# Patient Record
Sex: Male | Born: 1965 | Race: Black or African American | Hispanic: No | Marital: Married | Smoking: Never smoker
Health system: Southern US, Community
[De-identification: ages and names within clinical notes are randomized; demographics above are authoritative.]

## PROBLEM LIST (undated history)

## (undated) DIAGNOSIS — G8929 Other chronic pain: Secondary | ICD-10-CM

## (undated) DIAGNOSIS — N183 Chronic kidney disease, stage 3 unspecified: Secondary | ICD-10-CM

## (undated) HISTORY — DX: Chronic kidney disease, stage 3 unspecified: N18.30

## (undated) HISTORY — DX: Other chronic pain: G89.29

---

## 2012-05-22 ENCOUNTER — Emergency Department (HOSPITAL_COMMUNITY): Payer: Self-pay

## 2012-05-22 ENCOUNTER — Encounter (HOSPITAL_COMMUNITY): Payer: Self-pay | Admitting: *Deleted

## 2012-05-22 ENCOUNTER — Emergency Department (HOSPITAL_COMMUNITY)
Admission: EM | Admit: 2012-05-22 | Discharge: 2012-05-22 | Disposition: A | Discharge status: Home / Self Care | Payer: Self-pay | Attending: Emergency Medicine | Admitting: Emergency Medicine

## 2012-05-22 DIAGNOSIS — R0789 Other chest pain: Secondary | ICD-10-CM | POA: Insufficient documentation

## 2012-05-22 LAB — CK TOTAL AND CKMB (NOT AT ARMC)
CK, MB: 2.9 ng/mL (ref 0.3–4.0)
Relative Index: 0.6 (ref 0.0–2.5)
Total CK: 499 U/L — ABNORMAL HIGH (ref 7–232)

## 2012-05-22 LAB — CBC WITH DIFFERENTIAL/PLATELET
Basophils Absolute: 0 10*3/uL (ref 0.0–0.1)
HCT: 42.6 % (ref 39.0–52.0)
Lymphocytes Relative: 28 % (ref 12–46)
Neutro Abs: 4.1 10*3/uL (ref 1.7–7.7)
Platelets: 161 10*3/uL (ref 150–400)
RDW: 13.1 % (ref 11.5–15.5)
WBC: 6.8 10*3/uL (ref 4.0–10.5)

## 2012-05-22 LAB — TROPONIN I
Troponin I: <0.3 ng/mL (ref <0.30)
Troponin I: <0.3 ng/mL (ref <0.30)

## 2012-05-22 LAB — POCT I-STAT, CHEM 8
Calcium, Ion: 1.2 mmol/L (ref 1.12–1.32)
HCT: 46 % (ref 39.0–52.0)
TCO2: 22 mmol/L (ref 0–100)

## 2012-05-22 MED ORDER — SODIUM CHLORIDE 0.9 % IV BOLUS (SEPSIS)
1000.0000 mL | Freq: Once | INTRAVENOUS | Status: AC
  Administered 2012-05-22: 1000 mL via INTRAVENOUS

## 2012-05-22 MED ORDER — ASPIRIN 81 MG PO CHEW
324.0000 mg | CHEWABLE_TABLET | Freq: Once | ORAL | Status: AC
  Administered 2012-05-22: 162 mg via ORAL
  Filled 2012-05-22: qty 2

## 2012-05-22 NOTE — ED Notes (Signed)
Pt. Reports intermittent mid-sternal/left chest pain for the past two days. States that "I will have a squeezing pain for a couple seconds and then it will go away for hours sometimes and then come back".  Denies N/V or SOB.

## 2012-05-22 NOTE — ED Provider Notes (Signed)
History     CSN: [id number]  Arrival date & time 05/22/12  1154   First MD Initiated Contact with Patient 05/22/12 1211      Chief Complaint  Patient presents with  . Chest Pain    (Consider location/radiation/quality/duration/timing/severity/associated sxs/prior treatment) HPI  46 year old male with no prior cardiac history presents with chief complaint of chest pain. Patient states for the past couple days he has had intermittent sensation of a sharp pain to his mid chest. Pain usually lasting only for seconds and resolved without any treatment. This a.m. while sitting and talking he experiencing pressure and squeezing sensation to his left chest. Sensation lasting for 5 minutes and resolved. However, has never experiencing similar, which worries him. Nothing makes the pain worse or better. He denies associated nausea, diaphoresis, shortness of breath. He denies exertional component. States he is generally healthy. He is a nonsmoker. Patient denies any strenuous exercise activities, however does endorse any work on a regular basis. Patient also mentioned that he's been more stressed out than usual, and unsure of this is stress related. Denies any recent alcohol or recreational drug use. Denies calf pain or leg swelling.  History reviewed. No pertinent past medical history.  History reviewed. No pertinent past surgical history.  History reviewed. No pertinent family history.  History  Substance Use Topics  . Smoking status: Not on file  . Smokeless tobacco: Not on file  . Alcohol Use: No      Review of Systems  All other systems reviewed and are negative.    Allergies  Review of patient's allergies indicates no known allergies.  Home Medications  No current outpatient prescriptions on file.  BP 135/79  Pulse 80  Temp 98.6 F (37 C) (Oral)  Resp 18  SpO2 95%  Physical Exam  Nursing note and vitals reviewed. Constitutional: He appears well-developed and  well-nourished. No distress.       Awake, alert, nontoxic appearance. Muscularly built  HENT:  Head: Atraumatic.  Eyes: Conjunctivae are normal. Right eye exhibits no discharge. Left eye exhibits no discharge.  Neck: Normal range of motion. Neck supple. No JVD present.  Cardiovascular: Normal rate and regular rhythm.  Exam reveals no gallop and no friction rub.   No murmur heard. Pulmonary/Chest: Effort normal. No respiratory distress. He has no wheezes. He has no rales. He exhibits no tenderness.       L chest wall mildly tender on palpation without overlying skin changes.  No rash, no crepitus, no emphysema  Abdominal: Soft. There is no tenderness. There is no rebound.  Musculoskeletal: He exhibits no edema and no tenderness.       ROM appears intact, no obvious focal weakness  Neurological: He is alert.  Skin: Skin is warm and dry. No rash noted.  Psychiatric: He has a normal mood and affect.    ED Course  Procedures (including critical care time)  Labs Reviewed - No data to display No results found.   No diagnosis found.   Date: 05/22/2012  Rate: 82  Rhythm: normal sinus rhythm  QRS Axis: normal  Intervals: normal  ST/T Wave abnormalities: normal  Conduction Disutrbances:none  Narrative Interpretation: early repol  Old EKG Reviewed: none available  Results for orders placed during the hospital encounter of 05/22/12  CBC WITH DIFFERENTIAL      Component Value Range   WBC 6.8  4.0 - 10.5 K/uL   RBC 4.94  4.22 - 5.81 MIL/uL   Hemoglobin 14.8  13.0 -  17.0 g/dL   HCT 11.9  14.7 - 82.9 %   MCV 86.2  78.0 - 100.0 fL   MCH 30.0  26.0 - 34.0 pg   MCHC 34.7  30.0 - 36.0 g/dL   RDW 56.2  13.0 - 86.5 %   Platelets 161  150 - 400 K/uL   Neutrophils Relative 61  43 - 77 %   Neutro Abs 4.1  1.7 - 7.7 K/uL   Lymphocytes Relative 28  12 - 46 %   Lymphs Abs 1.9  0.7 - 4.0 K/uL   Monocytes Relative 10  3 - 12 %   Monocytes Absolute 0.7  0.1 - 1.0 K/uL   Eosinophils Relative 1   0 - 5 %   Eosinophils Absolute 0.1  0.0 - 0.7 K/uL   Basophils Relative 0  0 - 1 %   Basophils Absolute 0.0  0.0 - 0.1 K/uL  CK TOTAL AND CKMB      Component Value Range   Total CK 499 (*) 7 - 232 U/L   CK, MB 2.9  0.3 - 4.0 ng/mL   Relative Index 0.6  0.0 - 2.5  TROPONIN I      Component Value Range   Troponin I <0.30  <0.30 ng/mL  POCT I-STAT, CHEM 8      Component Value Range   Sodium 141  135 - 145 mEq/L   Potassium 4.2  3.5 - 5.1 mEq/L   Chloride 108  96 - 112 mEq/L   BUN 20  6 - 23 mg/dL   Creatinine, Ser 7.84 (*) 0.50 - 1.35 mg/dL   Glucose, Bld 93  70 - 99 mg/dL   Calcium, Ion 6.96  1.12 - 1.32 mmol/L   TCO2 22  0 - 100 mmol/L   Hemoglobin 15.6  13.0 - 17.0 g/dL   HCT 29.5  28.4 - 13.2 %   Dg Chest 2 View  05/22/2012  *RADIOLOGY REPORT*  Clinical Data: Chest pain and shortness of breath.  CHEST - 2 VIEW  Comparison: No priors.  Findings: Lung volumes are normal.  No consolidative airspace disease.  No pleural effusions.  No pneumothorax.  No pulmonary nodule or mass noted.  Pulmonary vasculature and the cardiomediastinal silhouette are within normal limits.  IMPRESSION: 1. No radiographic evidence of acute cardiopulmonary disease.  Original Report Authenticated By: Florencia Reasons, M.D.    44:01:02 Orthostatic Vital Signs Orthstatic P/BP - Pulse Rate: 78 ; Pulse Rate Source: Dinamap ; BP: 121/79 ; BP Location: Right arm ; BP Method: Automatic ; Patient Position, if appropriate: Lying Jody L Oberthaler 14:52:46 Orthostatic Vital Signs Orthstatic P/BP - Pulse Rate: 76 ; Pulse Rate Source: Dinamap ; BP: 127/82 ; BP Location: Right arm ; BP Method: Automatic ; Patient Position, if appropriate: Sitting Jody L Oberthaler 14:53 Orthostatic Vital Signs Orthstatic P/BP - Pulse Rate: 73 ; Pulse Rate Source: Dinamap ; BP: 123/92 ; BP Location: Right arm ; BP Method: Automatic ; Patient Position, if appropriate: Standing Jody L Oberthaler   MDM  Atypical chest pain. Patient has a TIMI  score of 0. He is afebrile with stable normal vital signs. No respiratory involvement.  He is muscularly built.  Pain not reproducible with palpation or with ROM.  Will check CK total to r/o rhabdo.  Will obtain ECG, CXR, Troponin, CBC, Istat.    2:47 PM Pt report to nurse that he experienced sensation of lightheadedness and blurry vision briefly yesterday when he was having his  chest discomfort.  Sxs transient. Will check orthostatic VS.    2:57 PM She has a total CK of 499. History of function is remarkable for creatinine of 1.6. Other this may be related to muscle breakdown from working out, however level not significant to consider for rhabdo. First set of troponin is unremarkable, will check delta troponin and will consult with cardiology.  Normal orthostatic vital sign  4:13 PM Normal delta troponin.  Pt asymptomatic.  Pt agrees to f/u with [facility] for further evaluation.  Pt stable to be discharge.    Fayrene Helper, PA-C 05/22/12 1614

## 2012-05-22 NOTE — ED Provider Notes (Signed)
Medical screening examination/treatment/procedure(s) were conducted as a shared visit with non-physician practitioner(s) and myself.  I personally evaluated the patient during the encounter  Doug Sou, MD 05/22/12 1635

## 2012-05-22 NOTE — ED Notes (Signed)
Pt. Talked to this RN about additional concerns. States that when he studies his vision sometimes gets blurry and he feels lightheaded. Also states this happened yesterday when he was not studying. Will notify MD.

## 2012-05-22 NOTE — ED Notes (Signed)
Reports having left side chest pain intermittent for several days but more severe and cramping today. Denies any sob. No acute distress noted at triage.

## 2012-05-22 NOTE — ED Provider Notes (Signed)
Complains of chest pain for several weeks intermittent lasting 1 or 2 seconds at a time. Today he had an episode lasting 15 seconds, sharp at left anterior chest followed by a dull ache which lasted for approximately 15 minutes. No associated symptoms, no nausea shortness of breath or sweatiness. Patient exercises 5 times per week and never has chest pain during exertion. Patient asymptomatic as I examine him Cardiac risk factors none On exam a well-developed well-nourished male alert nontoxic lungs clear auscultation heart regular rate and rhythm no murmurs abdomen nondistended nontender Rectal decision making symptoms highly atypical for acute coronary syndrome Plan outpatient cardiac workup Diagnosis atypical chest pain  Doug Sou, MD 05/22/12 1605

## 2012-05-22 NOTE — Discharge Instructions (Signed)
You have been evaluated for your chest discomfort.  Please contact [facility] for outpatient follow up.  Return if your symptoms worsen or if you have any other concerns.    Chest Pain (Nonspecific) Chest pain has many causes. Your pain could be caused by something serious, such as a heart attack or a blood clot in the lungs. It could also be caused by something less serious, such as a chest bruise or a virus. Follow up with your doctor. More lab tests or other studies may be needed to find the cause of your pain. Most of the time, nonspecific chest pain will improve within 2 to 3 days of rest and mild pain medicine. HOME CARE  For chest bruises, you may put ice on the sore area for 15 to 20 minutes, 3 to 4 times a day. Do this only if it makes you or your child feel better.   Put ice in a plastic bag.   Place a towel between the skin and the bag.   Rest for the next 2 to 3 days.   Go back to work if the pain improves.   See your doctor if the pain lasts longer than 1 to 2 weeks.   Only take medicine as told by your doctor.   Quit smoking if you smoke.  GET HELP RIGHT AWAY IF:   There is more pain or pain that spreads to the arm, neck, jaw, back, or belly (abdomen).   You or your child has shortness of breath.   You or your child coughs more than usual or coughs up blood.   You or your child has very bad back or belly pain, feels sick to his or her stomach (nauseous), or throws up (vomits).   You or your child has very bad weakness.   You or your child passes out (faints).   You or your child has a temperature by mouth above 102 F (38.9 C), not controlled by medicine.  Any of these problems may be serious and may be an emergency. Do not wait to see if the problems will go away. Get medical help right away. Call your local emergency services 911 in [place] Do not drive yourself to the hospital. MAKE SURE YOU:   Understand these instructions.   Will watch this  condition.   Will get help right away if you or your child is not doing well or gets worse.  Document Released: 04/28/2008 Document Revised: 10/30/2011 Document Reviewed: 04/28/2008 [facility] Patient Information 2012 [place], [place].

## 2012-05-22 NOTE — ED Notes (Signed)
MD at bedside. 

## 2012-07-25 ENCOUNTER — Emergency Department (HOSPITAL_COMMUNITY)
Admission: EM | Admit: 2012-07-25 | Discharge: 2012-07-25 | Disposition: A | Discharge status: Home / Self Care | Payer: Self-pay | Attending: Emergency Medicine | Admitting: Emergency Medicine

## 2012-07-25 ENCOUNTER — Encounter (HOSPITAL_COMMUNITY): Payer: Self-pay | Admitting: *Deleted

## 2012-07-25 DIAGNOSIS — H01009 Unspecified blepharitis unspecified eye, unspecified eyelid: Secondary | ICD-10-CM | POA: Insufficient documentation

## 2012-07-25 DIAGNOSIS — H01006 Unspecified blepharitis left eye, unspecified eyelid: Secondary | ICD-10-CM

## 2012-07-25 MED ORDER — ERYTHROMYCIN 5 MG/GM OP OINT: TOPICAL_OINTMENT | OPHTHALMIC | Status: AC

## 2012-07-25 NOTE — ED Notes (Signed)
Pt woke up with left eye swelling. Denies any injury. Reports mild tenderness/pressure. No blurred vision.

## 2012-07-26 NOTE — ED Provider Notes (Signed)
History     CSN: [id number]  Arrival date & time 07/25/12  6962   First MD Initiated Contact with Patient 07/25/12 1001      Chief Complaint  Patient presents with  . Facial Swelling    (Consider location/radiation/quality/duration/timing/severity/associated sxs/prior treatment) HPI Hx from pt. Brandon Byrd is a 46 y.o. male who presents with L eyelid swelling. States he noted this yesterday evening but woke up today and it was markedly worse. No known injury to the eye. Pt denies any FB sensation, visual change, redness of the eye itself, discharge or excess tearing. He thought it may have been pink eye and had some old antibiotic eyedrops that he was prescribed previously; he used these once but they did not help. Pt does report some mild tenderness to the area. He has never had anything like this before.  History reviewed. No pertinent past medical history.  History reviewed. No pertinent past surgical history.  History reviewed. No pertinent family history.  History  Substance Use Topics  . Smoking status: Never Smoker   . Smokeless tobacco: Not on file  . Alcohol Use: No      Review of Systems as per HPI  Allergies  Review of patient's allergies indicates no known allergies.  Home Medications   Current Outpatient Rx  Name Route Sig Dispense Refill  . ASPIRIN EC 81 MG PO TBEC Oral Take 81 mg by mouth daily.    . ADULT MULTIVITAMIN W/MINERALS CH Oral Take 1 tablet by mouth daily.    Marland Kitchen NAPROXEN SODIUM 220 MG PO TABS Oral Take 220-440 mg by mouth 2 (two) times daily as needed. For pain    . VITAMIN D (CHOLECALCIFEROL) PO Oral Take 1 capsule by mouth daily.    . ERYTHROMYCIN 5 MG/GM OP OINT  Place a 1/2 inch ribbon of ointment into the lower eyelid. Use twice a day for 7 days. 3.5 g 0    BP 125/79  Pulse 86  Temp 98.3 F (36.8 C) (Oral)  Resp 18  SpO2 97%  Physical Exam  Nursing note and vitals reviewed. Constitutional: He appears well-developed and  well-nourished. No distress.  HENT:  Head: Normocephalic and atraumatic.  Mouth/Throat: Oropharynx is clear and moist. No oropharyngeal exudate.  Eyes: Conjunctivae and EOM are normal. Pupils are equal, round, and reactive to light. Right eye exhibits no discharge. Left eye exhibits no discharge and no hordeolum. Left conjunctiva is not injected.       Edema and mild erythema to L upper lid, no obvious hordeolum. No drainage from eye. Sclera and conjunctiva nl  Neck: Normal range of motion.  Cardiovascular: Normal rate.   Pulmonary/Chest: Effort normal.  Musculoskeletal: Normal range of motion.  Neurological: He is alert.  Skin: Skin is warm and dry. He is not diaphoretic.  Psychiatric: He has a normal mood and affect.    ED Course  Procedures (including critical care time)  Labs Reviewed - No data to display No results found.   1. Blepharitis of left eye       MDM  Pt presents with edema and mild erythema to L upper lid. No other sx with this. Appears c/w blepharitis. Rx erythro ointment. Advised warm compresses. Gave ophthalmology contact info should this not improve. Reasons to return discussed.        Grant Fontana, PA-C 07/26/12 1432

## 2012-07-26 NOTE — ED Provider Notes (Signed)
Medical screening examination/treatment/procedure(s) were performed by non-physician practitioner and as supervising physician I was immediately available for consultation/collaboration.  Juliet Rude. Rubin Payor, MD 07/26/12 [phone]

## 2012-11-13 ENCOUNTER — Emergency Department (HOSPITAL_COMMUNITY): Payer: Self-pay

## 2012-11-13 ENCOUNTER — Emergency Department (HOSPITAL_COMMUNITY)
Admission: EM | Admit: 2012-11-13 | Discharge: 2012-11-13 | Disposition: A | Discharge status: Home / Self Care | Payer: Self-pay | Attending: Emergency Medicine | Admitting: Emergency Medicine

## 2012-11-13 ENCOUNTER — Encounter (HOSPITAL_COMMUNITY): Payer: Self-pay | Admitting: Family Medicine

## 2012-11-13 DIAGNOSIS — Z7982 Long term (current) use of aspirin: Secondary | ICD-10-CM | POA: Insufficient documentation

## 2012-11-13 DIAGNOSIS — R21 Rash and other nonspecific skin eruption: Secondary | ICD-10-CM | POA: Insufficient documentation

## 2012-11-13 DIAGNOSIS — Z79899 Other long term (current) drug therapy: Secondary | ICD-10-CM | POA: Insufficient documentation

## 2012-11-13 DIAGNOSIS — Z23 Encounter for immunization: Secondary | ICD-10-CM | POA: Insufficient documentation

## 2012-11-13 DIAGNOSIS — L02419 Cutaneous abscess of limb, unspecified: Secondary | ICD-10-CM | POA: Insufficient documentation

## 2012-11-13 DIAGNOSIS — IMO0002 Reserved for concepts with insufficient information to code with codable children: Secondary | ICD-10-CM

## 2012-11-13 LAB — BASIC METABOLIC PANEL
Chloride: 106 mEq/L (ref 96–112)
Creatinine, Ser: 1.62 mg/dL — ABNORMAL HIGH (ref 0.50–1.35)
GFR calc Af Amer: 57 mL/min — ABNORMAL LOW (ref >90)
GFR calc non Af Amer: 49 mL/min — ABNORMAL LOW (ref >90)
Potassium: 4.4 mEq/L (ref 3.5–5.1)

## 2012-11-13 LAB — CBC WITH DIFFERENTIAL/PLATELET
Basophils Absolute: 0 10*3/uL (ref 0.0–0.1)
Basophils Relative: 0 % (ref 0–1)
Eosinophils Absolute: 0.1 10*3/uL (ref 0.0–0.7)
MCHC: 33.7 g/dL (ref 30.0–36.0)
Monocytes Absolute: 1.1 10*3/uL — ABNORMAL HIGH (ref 0.1–1.0)
Neutro Abs: 8.3 10*3/uL — ABNORMAL HIGH (ref 1.7–7.7)
Neutrophils Relative %: 73 % (ref 43–77)
RDW: 13.1 % (ref 11.5–15.5)

## 2012-11-13 MED ORDER — LIDOCAINE-EPINEPHRINE 2 %-1:100000 IJ SOLN
20.0000 mL | Freq: Once | INTRAMUSCULAR | Status: DC
  Filled 2012-11-13: qty 20

## 2012-11-13 MED ORDER — CLINDAMYCIN PHOSPHATE 600 MG/50ML IV SOLN
600.0000 mg | Freq: Once | INTRAVENOUS | Status: AC
  Administered 2012-11-13: 600 mg via INTRAVENOUS
  Filled 2012-11-13: qty 50

## 2012-11-13 MED ORDER — CLINDAMYCIN HCL 150 MG PO CAPS: 150.0000 mg | ORAL_CAPSULE | Freq: Four times a day (QID) | ORAL | Status: DC

## 2012-11-13 MED ORDER — HYDROCODONE-ACETAMINOPHEN 5-325 MG PO TABS: 2.0000 | ORAL_TABLET | ORAL | Status: DC | PRN

## 2012-11-13 MED ORDER — TETANUS-DIPHTH-ACELL PERTUSSIS 5-2.5-18.5 LF-MCG/0.5 IM SUSP
0.5000 mL | Freq: Once | INTRAMUSCULAR | Status: AC
Start: 2012-11-13 — End: 2012-11-13
  Administered 2012-11-13: 0.5 mL via INTRAMUSCULAR
  Filled 2012-11-13: qty 0.5

## 2012-11-13 MED ORDER — OXYCODONE-ACETAMINOPHEN 5-325 MG PO TABS
1.0000 | ORAL_TABLET | Freq: Once | ORAL | Status: AC
  Administered 2012-11-13: 1 via ORAL
  Filled 2012-11-13: qty 1

## 2012-11-13 NOTE — ED Notes (Signed)
Per pt sts 1 week of swollen knee and pain. sts he thinks there is a cyst on it. Hx of the same.

## 2012-11-13 NOTE — ED Provider Notes (Signed)
History     CSN: [id number]  Arrival date & time 11/13/12  1517   First MD Initiated Contact with Patient 11/13/12 1702      Chief Complaint  Patient presents with  . Knee Pain    (Consider location/radiation/quality/duration/timing/severity/associated sxs/prior treatment) HPI  46 year old male presents complaining of left knee pain. Patient reports for the past week he has noticed a pimple on his right anterior knee in which he tried to pop it. Subsequently he notice increase swelling and pain to his knee. Onset was gradual, persistent, moderate in intensity, with associated swelling, and redness that extends down to his leg. Mildly improved with Tylenol and ibuprofen. He denies fever, chills, numbness, weakness, joint pain. No history of diabetes. No history of abscess. No history of gout. Patient does not recall his last tetanus shot.  History reviewed. No pertinent past medical history.  History reviewed. No pertinent past surgical history.  History reviewed. No pertinent family history.  History  Substance Use Topics  . Smoking status: Never Smoker   . Smokeless tobacco: Not on file  . Alcohol Use: No      Review of Systems  Constitutional: Negative for fever and chills.  Musculoskeletal: Negative for back pain, joint swelling and arthralgias.  Skin: Positive for rash and wound.  Neurological: Negative for headaches.    Allergies  Review of patient's allergies indicates no known allergies.  Home Medications   Current Outpatient Rx  Name  Route  Sig  Dispense  Refill  . ASPIRIN EC 81 MG PO TBEC   Oral   Take 81 mg by mouth daily.         . ADULT MULTIVITAMIN W/MINERALS CH   Oral   Take 1 tablet by mouth daily.         Marland Kitchen NAPROXEN SODIUM 220 MG PO TABS   Oral   Take 220-440 mg by mouth 2 (two) times daily as needed. For pain         . VITAMIN D (CHOLECALCIFEROL) PO   Oral   Take 1 capsule by mouth daily.           BP 117/72  Pulse 95   Temp 98 F (36.7 C)  Resp 18  SpO2 95%  Physical Exam  Nursing note and vitals reviewed. Constitutional: He is oriented to person, place, and time. He appears well-developed and well-nourished. No distress.  HENT:  Head: Atraumatic.  Neck: Neck supple.  Musculoskeletal: He exhibits edema (depending edema surrounding the anterior right knee extending down to mid shin with cellulitic changes).       And there is an abscess with associate cellulitic changes to right anterior knee, tender on palpation with fluctuance and induration. No apparent joint involvement.  Neurological: He is alert and oriented to person, place, and time.  Skin: Skin is warm.    ED Course  Procedures (including critical care time)   Labs Reviewed  CBC WITH DIFFERENTIAL  BASIC METABOLIC PANEL   Results for orders placed during the hospital encounter of 11/13/12  CBC WITH DIFFERENTIAL      Component Value Range   WBC 11.5 (*) 4.0 - 10.5 K/uL   RBC 4.61  4.22 - 5.81 MIL/uL   Hemoglobin 13.7  13.0 - 17.0 g/dL   HCT 84.1  32.4 - 40.1 %   MCV 88.3  78.0 - 100.0 fL   MCH 29.7  26.0 - 34.0 pg   MCHC 33.7  30.0 - 36.0 g/dL   RDW  13.1  11.5 - 15.5 %   Platelets 197  150 - 400 K/uL   Neutrophils Relative 73  43 - 77 %   Neutro Abs 8.3 (*) 1.7 - 7.7 K/uL   Lymphocytes Relative 16  12 - 46 %   Lymphs Abs 1.9  0.7 - 4.0 K/uL   Monocytes Relative 10  3 - 12 %   Monocytes Absolute 1.1 (*) 0.1 - 1.0 K/uL   Eosinophils Relative 1  0 - 5 %   Eosinophils Absolute 0.1  0.0 - 0.7 K/uL   Basophils Relative 0  0 - 1 %   Basophils Absolute 0.0  0.0 - 0.1 K/uL  BASIC METABOLIC PANEL      Component Value Range   Sodium 141  135 - 145 mEq/L   Potassium 4.4  3.5 - 5.1 mEq/L   Chloride 106  96 - 112 mEq/L   CO2 23  19 - 32 mEq/L   Glucose, Bld 103 (*) 70 - 99 mg/dL   BUN 28 (*) 6 - 23 mg/dL   Creatinine, Ser 5.28 (*) 0.50 - 1.35 mg/dL   Calcium 9.6  8.4 - 41.3 mg/dL   GFR calc non Af Amer 49 (*) >90 mL/min   GFR calc  Af Amer 57 (*) >90 mL/min   Dg Knee 2 Views Right  11/13/2012  *RADIOLOGY REPORT*  Clinical Data: Knee pain  RIGHT KNEE - 1-2 VIEW  Comparison: None.  Findings: Two views of the right knee submitted.  No acute fracture or subluxation.  There is infrapatellar/pretibial anterior soft tissue swelling.  IMPRESSION: No acute fracture or subluxation.  There is infrapatellar/pretibial anterior soft tissue swelling.   Original Report Authenticated By: Natasha Mead, M.D.      INCISION AND DRAINAGE Performed by: Fayrene Helper Consent: Verbal consent obtained. Risks and benefits: risks, benefits and alternatives were discussed Type: abscess  Body area: R anterior knee  Anesthesia: local infiltration  Incision was made with a scalpel.  Local anesthetic: lidocaine 2% w epinephrine  Anesthetic total: 3 ml  Complexity: complex Blunt dissection to break up loculations  Drainage: purulent  Drainage amount: moderate  Packing material: 1/4 in iodoform gauze  Patient tolerance: Patient tolerated the procedure well with no immediate complications.    1.  Cutaneous abscess cellulitis of R anterior knee.    MDM  Patient presents with an abscess to his right anterior knee. With no significant evidence of joint involvement. Doubt septic arthritis.  There are cellulitic skin changes extending down to mid shin.  Will performed I&D and will give clinda via IV as pt has IV line established.  tdap given, pain medication given.  6:05 PM Successful I&D with moderate pustular discharge.  Pt tolerates well.  IV clinda given.  Care instruction including warm compress discussed.  Pt to return in 2 days for wound recheck and packing removal.    7:38 PM IV abx finished.  Pt stable for discharge.    BP 126/80  Pulse 89  Temp 98.8 F (37.1 C) (Oral)  Resp 18  SpO2 97%  I have reviewed nursing notes and vital signs. I personally reviewed the imaging tests through PACS system  I reviewed available  ER/hospitalization records thought the EMR     Fayrene Helper, [place] 11/13/12 1938

## 2012-11-14 NOTE — ED Provider Notes (Signed)
Medical screening examination/treatment/procedure(s) were performed by non-physician practitioner and as supervising physician I was immediately available for consultation/collaboration.   Carleene Cooper III, MD 11/14/12 [phone]

## 2015-11-25 HISTORY — PX: COLONOSCOPY: SHX174

## 2019-11-25 DIAGNOSIS — E119 Type 2 diabetes mellitus without complications: Secondary | ICD-10-CM

## 2019-11-25 HISTORY — DX: Type 2 diabetes mellitus without complications: E11.9

## 2020-10-31 ENCOUNTER — Other Ambulatory Visit: Payer: Self-pay

## 2020-10-31 ENCOUNTER — Ambulatory Visit (INDEPENDENT_AMBULATORY_CARE_PROVIDER_SITE_OTHER): Payer: 59 | Admitting: Medical

## 2020-10-31 ENCOUNTER — Encounter: Payer: Self-pay | Admitting: Medical

## 2020-10-31 VITALS — BP 118/88 | HR 88 | Ht 68.0 in | Wt 200.0 lb

## 2020-10-31 DIAGNOSIS — N189 Chronic kidney disease, unspecified: Secondary | ICD-10-CM | POA: Insufficient documentation

## 2020-10-31 DIAGNOSIS — E1122 Type 2 diabetes mellitus with diabetic chronic kidney disease: Secondary | ICD-10-CM | POA: Diagnosis not present

## 2020-10-31 LAB — POCT GLYCOSYLATED HEMOGLOBIN (HGB A1C): Hemoglobin A1C: 6.6 % — AB (ref 4.0–5.6)

## 2020-10-31 MED ORDER — BD PEN NEEDLE NANO U/F 32G X 4 MM MISC: 1.0000 | Freq: Every day | 2 refills | Status: DC

## 2020-10-31 MED ORDER — OZEMPIC (0.25 OR 0.5 MG/DOSE) 2 MG/1.5ML ~~LOC~~ SOPN: 0.5000 mg | PEN_INJECTOR | SUBCUTANEOUS | 2 refills | Status: DC

## 2020-10-31 NOTE — Progress Notes (Signed)
Subjective:  Brandon Byrd is a 54 y.o. male who presents for Chief Complaint  Patient presents with  . New Patient (Initial Visit)    consult due to new onset diabetes-has chronic Kidney disease   . Blood Sugar Problem    new diabetic      Here to establish care.  Usually sees [place] [facility] in [place], but needs local PCP.    He has concerns about kidney and diabetes.    He has been diagnosed with kidney disease about a year and a half ago.  He is not exactly sure what the cause was.  He does note prior use of Aleve fairly regularly and bodybuilding supplements.  But he was never really advised what the cause ultimately was.  He notes history of imaging at the [facility] and was told many years ago that he had a "weak" kidney.    Diabetes was diagnosed 6 months ago.  He was initially advised to use Metformin but he declined after researching potential problems with the medicine and did not feel comfortable with it.  He took it a few times and felt horrible at the gym so he quit taking it.  His provider at the [facility] recommended Ozempic which she was interested in doing but was advised to get it through PCP here as the [place] system would make it more complicated to get Ozempic  Otherwise been in usual state of health.  He works out rarely.  He tries to eat very healthy.  He has been checking home sugars and they have been less than 120 fasting  He brought in some lab work he had done just in the last week for comprehensive metabolic, CBC, lipid, hepatic.  No other aggravating or relieving factors.    No other c/o.  No past medical history on file.  Current Outpatient Medications on File Prior to Visit  Medication Sig Dispense Refill  . metFORMIN (GLUCOPHAGE-XR) 500 MG 24 hr tablet Take 500 mg by mouth 2 (two) times daily.    Marland Kitchen aspirin EC 81 MG tablet Take 81 mg by mouth daily. (Patient not taking: Reported on 10/31/2020)    . clindamycin (CLEOCIN) 150 MG capsule Take 1 capsule (150 mg total)  by mouth every 6 (six) hours. (Patient not taking: Reported on 10/31/2020) 28 capsule 0  . HYDROcodone-acetaminophen (NORCO/VICODIN) 5-325 MG per tablet Take 2 tablets by mouth every 4 (four) hours as needed for pain. (Patient not taking: Reported on 10/31/2020) 10 tablet 0  . Multiple Vitamin (MULTIVITAMIN WITH MINERALS) TABS Take 1 tablet by mouth daily. (Patient not taking: Reported on 10/31/2020)    . naproxen sodium (ANAPROX) 220 MG tablet Take 220-440 mg by mouth 2 (two) times daily as needed. For pain (Patient not taking: Reported on 10/31/2020)    . VITAMIN D, CHOLECALCIFEROL, PO Take 1 capsule by mouth daily. (Patient not taking: Reported on 10/31/2020)     No current facility-administered medications on file prior to visit.     The following portions of the patient's history were reviewed and updated as appropriate: allergies, current medications, past family history, past medical history, past social history, past surgical history and problem list.  ROS Otherwise as in subjective above   Objective: BP 118/88   Pulse 88   Ht 5\' 8"  (1.727 m)   Wt 200 lb (90.7 kg)   SpO2 95%   BMI 30.41 kg/m   General appearance: alert, no distress, well developed, well nourished Neck: supple, no lymphadenopathy, no  thyromegaly, no masses Heart: RRR, normal S1, S2, no murmurs Lungs: CTA bilaterally, no wheezes, rhonchi, or rales Abdomen: +bs, soft, non tender, non distended, no masses, no hepatomegaly, no splenomegaly Pulses: 2+ radial pulses, 2+ pedal pulses, normal cap refill Ext: no edema   Assessment: Encounter Diagnoses  Name Primary?  . Chronic kidney disease, unspecified CKD stage Yes  . Type 2 diabetes mellitus with chronic kidney disease, without long-term current use of insulin, unspecified CKD stage (HCC)      Plan: We discussed his past medical history.  Discussed possible causes of kidney disease.  He has used NSAIDs regularly in the past along with bodybuilding supplements.   No prior history of hypertension.  He does note history of a "weak" kidney many years ago so maybe there is some congenital underlying issue.  I reviewed his recent blood work showing creatinine about 1.7.  We will request records from the [facility] including prior imaging of the kidney and other evaluation with  Diabetes-begin trial of Ozempic.  We discussed risk, benefits, proper use of medicine.  Demonstrated how to use the pen device  Discussed diagnosis of diabetes, criteria to make the diagnosis, possible complications of diabetes, and the opportunity to make lifestyle changes to get this under control.  Discussed short term goals of diabetes care.    Discussed follow up, typically every 3 months, lab monitoring, importance of HgbA1C.   Discussed diet in great detail, importance of exercise.  Discussed vaccinations, discussed general preventative measures including eye exams yearly with eye doctor, dental care, routine f/u here.  Discussed glucose monitoring.    [name] was seen today for new patient (initial visit) and blood sugar problem.  Diagnoses and all orders for this visit:  Chronic kidney disease, unspecified CKD stage  Type 2 diabetes mellitus with chronic kidney disease, without long-term current use of insulin, unspecified CKD stage (HCC) -     HgB A1c  Other orders -     Semaglutide,0.25 or 0.5MG /DOS, (OZEMPIC, 0.25 OR 0.5 MG/DOSE,) 2 MG/1.5ML SOPN; Inject 0.5 mg into the skin once a week. -     Insulin Pen Needle (BD PEN NEEDLE NANO U/F) 32G X 4 MM MISC; 1 each by Does not apply route at bedtime.    Follow up: pending records from [place]

## 2020-11-01 ENCOUNTER — Encounter: Payer: Self-pay | Admitting: Medical

## 2020-11-06 ENCOUNTER — Telehealth: Payer: Self-pay | Admitting: Medical

## 2020-11-06 NOTE — Telephone Encounter (Signed)
Pt called and states that since he has been on the ozempic his fingers has looked like that have been soaked in water kinda like after you have been in the pool. He wants to know if this a normal side effect  Also he states you and him was talking about something you wear on your shoulder, he wants to know what that was,  Pt can be reached at [phone]

## 2020-11-07 NOTE — Telephone Encounter (Signed)
Lmom for patient return call about previous message.

## 2020-11-07 NOTE — Telephone Encounter (Signed)
Is he talking about the freestyle libre monitoring device?  If so have him check insurance to see if they will cover this.  If so I will send this to the pharmacy as it is quite easy to use compared to regular fingerstick glucose monitoring  That is odd with the finger concern.  I have not seen this before in regards to Ozempic.  Have him take a picture of possible and sent through my chart or stop the medicine for 2 weeks and let us see if this continues or not?  We are waiting for records from the [facility]

## 2020-11-07 NOTE — Telephone Encounter (Signed)
Spoke to patient and he will call insurance to check on [place]. He is not having any issues with his fingers this morning. Stated it was yesterday and the day before. He will let us know if he has anymore issues with his finger. Pt has been provided information to set up his mychart.

## 2020-12-06 ENCOUNTER — Other Ambulatory Visit: Payer: Self-pay

## 2020-12-06 ENCOUNTER — Ambulatory Visit (INDEPENDENT_AMBULATORY_CARE_PROVIDER_SITE_OTHER): Payer: 59 | Admitting: Medical

## 2020-12-06 ENCOUNTER — Encounter: Payer: Self-pay | Admitting: Medical

## 2020-12-06 VITALS — BP 112/70 | HR 98 | Ht 68.0 in | Wt 191.6 lb

## 2020-12-06 DIAGNOSIS — N1831 Chronic kidney disease, stage 3a: Secondary | ICD-10-CM | POA: Diagnosis not present

## 2020-12-06 DIAGNOSIS — D509 Iron deficiency anemia, unspecified: Secondary | ICD-10-CM

## 2020-12-06 DIAGNOSIS — R7989 Other specified abnormal findings of blood chemistry: Secondary | ICD-10-CM

## 2020-12-06 DIAGNOSIS — E1122 Type 2 diabetes mellitus with diabetic chronic kidney disease: Secondary | ICD-10-CM | POA: Diagnosis not present

## 2020-12-06 DIAGNOSIS — Z1211 Encounter for screening for malignant neoplasm of colon: Secondary | ICD-10-CM

## 2020-12-06 DIAGNOSIS — R634 Abnormal weight loss: Secondary | ICD-10-CM

## 2020-12-06 DIAGNOSIS — R6889 Other general symptoms and signs: Secondary | ICD-10-CM

## 2020-12-06 LAB — POCT URINALYSIS DIP (PROADVANTAGE DEVICE)
Bilirubin, UA: NEGATIVE
Blood, UA: NEGATIVE
Glucose, UA: NEGATIVE mg/dL
Ketones, POC UA: NEGATIVE mg/dL
Leukocytes, UA: NEGATIVE
Nitrite, UA: NEGATIVE
Protein Ur, POC: 30 mg/dL — AB
Specific Gravity, Urine: 1.015
Urobilinogen, Ur: 0.2
pH, UA: 6 (ref 5.0–8.0)

## 2020-12-06 NOTE — Progress Notes (Signed)
Subjective:  Brandon Byrd is a 55 y.o. male who presents for Chief Complaint  Patient presents with  . Weight Loss    Weight decrease in 1 month      Here for recheck.  I saw him as a new patient in December 2021.  Formally he was primarily seen in the [facility]  He started Ozempic last visit and has lost about 9 or 10 pounds in the last month.  He is mainly concerned about loss of muscle mass and definition.  He showed me a picture from the summer where he was more muscular and he feels like he is lost a lot of that shape in addition to the wall some weight.  His clothing do not fit right and fit too loose now.  He denies fever, night sweats, no vomiting, no diarrhea, no urinary issues.  No chest pain or dyspnea no leg swelling.  No numbness tingling or weakness.  He does get some nausea with the Ozempic  He denies major shift in the blood sugars.  He is averaging 140.  No polyuria, no polydipsia, no vision change.  The only other diabetes medicine he has been on metformin and he did not tolerate that.  He has made recent dietary changes.  He eats in the rice no white flour.  He does eat lots of fruits vegetables and lean meats and nuts and lentils.  He has always been a gym rat and health nut.  He is a Pharmacist, community and exercises regularly.  He does not specifically note major change in muscle mass per se but does have a decrease in his overall weight capacity weightlifting but he goes through fluctuations in general.  He is always been easy to lose or gain weight with minimal effort  No family history of thyroid disease.  The only cancer in the family was paternal grandfather died of colon cancer.  He does note history of chronic kidney disease and elevated liver test in the past.  He does not think he has been screened for hepatitis in the past.  He has never shared needles or done street drugs.  No other aggravating or relieving factors.    No other c/o.  Past Medical History:   Diagnosis Date  . Chronic back pain   . CKD (chronic kidney disease) stage 3, GFR 30-59 ml/min (HCC)   . Diabetes mellitus (HCC) 2021   Past Surgical History:  Procedure Laterality Date  . COLONOSCOPY  2017   [facility]   The following portions of the patient's history were reviewed and updated as appropriate: allergies, current medications, past family history, past medical history, past social history, past surgical history and problem list.  ROS Otherwise as in subjective above  Objective: BP 112/70   Pulse 98   Ht 5\' 8"  (1.727 m)   Wt 191 lb 9.6 oz (86.9 kg)   SpO2 97%   BMI 29.13 kg/m   Wt Readings from Last 3 Encounters:  12/06/20 191 lb 9.6 oz (86.9 kg)  10/31/20 200 lb (90.7 kg)   BP Readings from Last 3 Encounters:  12/06/20 112/70  10/31/20 118/88  11/13/12 126/80    General appearance: alert, no distress, well developed, well nourished, muscular African-American male No obvious atrophy of muscle noted no asymmetry of muscle bilaterally Per his picture, he has lost some overall appearance of mass but it could be due to the effects of weight loss from his symptoms and  he does use creatine bodybuilding supplement which can fluctuate water weight Neck: supple, no lymphadenopathy, no thyromegaly, no masses Heart: RRR, normal S1, S2, no murmurs Lungs: CTA bilaterally, no wheezes, rhonchi, or rales Abdomen: +bs, soft, non tender, non distended, no masses, no hepatomegaly, no splenomegaly Pulses: 2+ radial pulses, 2+ pedal pulses, normal cap refill Ext: no edema    Assessment: Encounter Diagnoses  Name Primary?  . Type 2 diabetes mellitus with chronic kidney disease, without long-term current use of insulin, unspecified CKD stage (HCC) Yes  . Weight loss   . Stage 3a chronic kidney disease (HCC)   . Elevated LFTs   . Fluctuation of weight   . Iron deficiency anemia, unspecified iron deficiency anemia type   . Screen for colon  cancer      Plan: Diabetes type 2- he is responding very well to low-dose Ozempic.  I advised that if he continues to lose rapid weight we may have to modify the regimen a little.  He is eating healthy and exercising regularly.  Additional labs as below today  Weight loss-likely due to Ozempic.  We discussed other causes of sudden weight loss.  No other necessary red flags right now.  We do not have a copy of his last colonoscopy but he is likely due anytime.  He notes normal PSA within the last year.  No fevers no night sweats no bleeding no other obvious concern.  We will send him home with Hemoccult cards.  Labs as below  History of CKD 3 A -I reviewed labs he had done in December 2021 from the [facility].  We discussed goals of therapy to keep labs under control with healthy blood pressure and keeping diabetes under control  Elevated LFTs-hepatitis screen today, consider ultrasound of abdomen  Weight fluctuation-labs as below  Iron deficiency anemia- low end of normal iron in December with low sats.  Additional labs today as below.  Consider adding iron therapy, Hemoccult cards sent home today    [name] was seen today for weight loss.  Diagnoses and all orders for this visit:  Type 2 diabetes mellitus with chronic kidney disease, without long-term current use of insulin, unspecified CKD stage (HCC) -     Microalbumin/Creatinine Ratio, Urine -     POCT Urinalysis DIP (Proadvantage Device)  Weight loss -     TSH  Stage 3a chronic kidney disease (HCC) -     Microalbumin/Creatinine Ratio, Urine -     POCT Urinalysis DIP (Proadvantage Device)  Elevated LFTs -     Hepatitis C antibody -     Hepatitis B surface antigen -     CK  Fluctuation of weight -     TSH -     CK  Iron deficiency anemia, unspecified iron deficiency anemia type  Screen for colon cancer    Follow up: pending labs

## 2020-12-07 LAB — TSH: TSH: 1.65 u[IU]/mL (ref 0.450–4.500)

## 2020-12-07 LAB — MICROALBUMIN / CREATININE URINE RATIO
Creatinine, Urine: 87 mg/dL
Microalb/Creat Ratio: 129 mg/g creat — ABNORMAL HIGH (ref 0–29)
Microalbumin, Urine: 112.3 ug/mL

## 2020-12-07 LAB — CK: Total CK: 550 U/L (ref 41–331)

## 2020-12-07 LAB — HEPATITIS B SURFACE ANTIGEN: Hepatitis B Surface Ag: NEGATIVE

## 2020-12-07 LAB — HEPATITIS C ANTIBODY: Hep C Virus Ab: <0.1 s/co ratio (ref 0.0–0.9)

## 2020-12-12 ENCOUNTER — Other Ambulatory Visit: Payer: Self-pay | Admitting: Medical

## 2020-12-12 MED ORDER — ASPIRIN EC 81 MG PO TBEC: 81.0000 mg | DELAYED_RELEASE_TABLET | Freq: Every day | ORAL | 3 refills | Status: DC

## 2020-12-12 MED ORDER — VALSARTAN 40 MG PO TABS: 40.0000 mg | ORAL_TABLET | Freq: Every day | ORAL | 2 refills | Status: DC

## 2020-12-12 MED ORDER — ROSUVASTATIN CALCIUM 10 MG PO TABS: 10.0000 mg | ORAL_TABLET | Freq: Every day | ORAL | 3 refills | Status: DC

## 2020-12-12 MED ORDER — INSULIN GLARGINE (1 UNIT DIAL) 300 UNIT/ML ~~LOC~~ SOPN: 5.0000 [IU] | PEN_INJECTOR | Freq: Every day | SUBCUTANEOUS | 5 refills | Status: DC

## 2020-12-12 MED ORDER — BD PEN NEEDLE NANO U/F 32G X 4 MM MISC: 1.0000 | Freq: Every day | 5 refills | Status: DC

## 2020-12-13 ENCOUNTER — Other Ambulatory Visit: Payer: Self-pay

## 2020-12-13 MED ORDER — ASPIRIN EC 81 MG PO TBEC: 81.0000 mg | DELAYED_RELEASE_TABLET | Freq: Every day | ORAL | 3 refills | Status: DC

## 2020-12-13 MED ORDER — ROSUVASTATIN CALCIUM 10 MG PO TABS: 10.0000 mg | ORAL_TABLET | Freq: Every day | ORAL | 3 refills | Status: AC

## 2020-12-13 MED ORDER — VALSARTAN 40 MG PO TABS: 40.0000 mg | ORAL_TABLET | Freq: Every day | ORAL | 2 refills | Status: DC

## 2020-12-13 MED ORDER — INSULIN GLARGINE (1 UNIT DIAL) 300 UNIT/ML ~~LOC~~ SOPN: 5.0000 [IU] | PEN_INJECTOR | Freq: Every day | SUBCUTANEOUS | 5 refills | Status: DC

## 2020-12-13 MED ORDER — BD PEN NEEDLE NANO U/F 32G X 4 MM MISC: 1.0000 | Freq: Every day | 5 refills | Status: DC

## 2021-01-02 ENCOUNTER — Encounter: Payer: Self-pay | Admitting: Medical

## 2021-03-02 ENCOUNTER — Other Ambulatory Visit: Payer: Self-pay | Admitting: Medical

## 2021-05-02 ENCOUNTER — Telehealth: Payer: Self-pay | Admitting: Medical

## 2021-05-02 NOTE — Telephone Encounter (Signed)
Dismissal letter in guarantor snapshot  °

## 2021-05-11 ENCOUNTER — Encounter (HOSPITAL_COMMUNITY): Payer: Self-pay

## 2021-05-11 ENCOUNTER — Other Ambulatory Visit: Payer: Self-pay

## 2021-05-11 ENCOUNTER — Emergency Department (HOSPITAL_COMMUNITY)
Admission: EM | Admit: 2021-05-11 | Discharge: 2021-05-11 | Disposition: A | Discharge status: Home / Self Care | Payer: Self-pay | Attending: Emergency Medicine | Admitting: Emergency Medicine

## 2021-05-11 DIAGNOSIS — K008 Other disorders of tooth development: Secondary | ICD-10-CM | POA: Insufficient documentation

## 2021-05-11 DIAGNOSIS — K036 Deposits [accretions] on teeth: Secondary | ICD-10-CM

## 2021-05-11 DIAGNOSIS — E1122 Type 2 diabetes mellitus with diabetic chronic kidney disease: Secondary | ICD-10-CM | POA: Insufficient documentation

## 2021-05-11 DIAGNOSIS — K1379 Other lesions of oral mucosa: Secondary | ICD-10-CM | POA: Insufficient documentation

## 2021-05-11 DIAGNOSIS — K068 Other specified disorders of gingiva and edentulous alveolar ridge: Secondary | ICD-10-CM

## 2021-05-11 DIAGNOSIS — N1831 Chronic kidney disease, stage 3a: Secondary | ICD-10-CM | POA: Insufficient documentation

## 2021-05-11 DIAGNOSIS — Z794 Long term (current) use of insulin: Secondary | ICD-10-CM | POA: Insufficient documentation

## 2021-05-11 NOTE — ED Triage Notes (Signed)
Patient told to come to ED to have gum checked for area they were concerned with. No pain, nonsmoker

## 2021-05-11 NOTE — ED Provider Notes (Signed)
[facility] EMERGENCY DEPARTMENT Provider Note   CSN: [id number] Arrival date & time: 05/11/21  5176     History CC:  Lesion in mouth  Brandon Byrd is a 55 y.o. male presenting to ED with concern for oral gumline lesion.  He reports he noted this first 6 months ago, as a painless white plaque-like lesion on his left lower posterior gumline.  He went to a dental clinic recently and was told by one of the dentists there that he should see a specialist for a biopsy, because there was some concern this might be malignant.  He has no dental insurance.  He came to the ED instead with the hopes that this procedure could be arranged in the ED.  He denies fevers, chills, weight loss, hx of smoking, or family hx of oral or esophageal cancer.   He reports he is otherwise healthy aside from diabetes.  HPI     Past Medical History:  Diagnosis Date   Chronic back pain    CKD (chronic kidney disease) stage 3, GFR 30-59 ml/min (HCC)    Diabetes mellitus (HCC) 2021    Patient Active Problem List   Diagnosis Date Noted   Weight loss 12/06/2020   Stage 3a chronic kidney disease (HCC) 12/06/2020   Elevated LFTs 12/06/2020   Fluctuation of weight 12/06/2020   Iron deficiency anemia 12/06/2020   Screen for colon cancer 12/06/2020   Chronic kidney disease 10/31/2020   Type 2 diabetes mellitus with chronic kidney disease, without long-term current use of insulin (HCC) 10/31/2020    Past Surgical History:  Procedure Laterality Date   COLONOSCOPY  2017   [facility]       No family history on file.  Social History   Tobacco Use   Smoking status: Never   Smokeless tobacco: Never  Substance Use Topics   Alcohol use: No   Drug use: No    Home Medications Prior to Admission medications   Medication Sig Start Date End Date Taking? Authorizing Provider  acetaminophen (TYLENOL) 500 MG tablet Take 1,000 mg by mouth every 6 (six) hours as  needed (for pain).   Yes [provider]  insulin glargine, 1 Unit Dial, (TOUJEO) 300 UNIT/ML Solostar Pen Inject 5 Units into the skin daily. Patient taking differently: Inject 5 Units into the skin in the morning. 12/13/20  Yes Tysinger, Kermit Balo, PA-C  aspirin EC 81 MG tablet Take 1 tablet (81 mg total) by mouth daily. Patient not taking: Reported on 05/11/2021 12/13/20   Tysinger, Kermit Balo, PA-C  HYDROcodone-acetaminophen (NORCO/VICODIN) 5-325 MG per tablet Take 2 tablets by mouth every 4 (four) hours as needed for pain. Patient not taking: No sig reported 11/13/12   Fayrene Helper, PA-C  Insulin Pen Needle (BD PEN NEEDLE NANO U/F) 32G X 4 MM MISC 1 each by Does not apply route at bedtime. 12/13/20   Tysinger, Kermit Balo, PA-C  Multiple Vitamin (MULTIVITAMIN WITH MINERALS) TABS Take 1 tablet by mouth daily.    [provider]  rosuvastatin (CRESTOR) 10 MG tablet Take 1 tablet (10 mg total) by mouth daily. Patient not taking: No sig reported 12/13/20 12/13/21  Tysinger, Kermit Balo, PA-C  valsartan (DIOVAN) 40 MG tablet TAKE 1 TABLET BY MOUTH EVERY DAY Patient not taking: No sig reported 03/04/21   Tysinger, Kermit Balo, PA-C  VITAMIN D, CHOLECALCIFEROL, PO Take 1 capsule by mouth daily.    [provider]  Allergies    Patient has no known allergies.  Review of Systems   Review of Systems  Constitutional:  Negative for chills, fever and unexpected weight change.  HENT:  Positive for dental problem. Negative for trouble swallowing.   Skin:  Negative for color change and rash.   Physical Exam Updated Vital Signs BP (!) 135/91 (BP Location: Left Arm)   Pulse 76   Temp 98.1 F (36.7 C) (Oral)   Resp 17   SpO2 93%   Physical Exam Constitutional:      General: He is not in acute distress. HENT:     Head: Normocephalic and atraumatic.     Mouth/Throat:     Comments: Small firm painless white place on posterior lower left gum line overlying wisdom tooth approx tooth  #17 Eyes:     Conjunctiva/sclera: Conjunctivae normal.     Pupils: Pupils are equal, round, and reactive to light.  Cardiovascular:     Rate and Rhythm: Normal rate and regular rhythm.  Pulmonary:     Effort: No respiratory distress.  Skin:    General: Skin is warm and dry.  Neurological:     Mental Status: He is alert.  Psychiatric:        Mood and Affect: Mood normal.        Behavior: Behavior normal.    ED Results / Procedures / Treatments   Labs (all labs ordered are listed, but only abnormal results are displayed) Labs Reviewed - No data to display  EKG None  Radiology No results found.  Procedures Procedures   Medications Ordered in ED Medications - No data to display  ED Course  I have reviewed the triage vital signs and the nursing notes.  Pertinent labs & imaging results that were available during my care of the patient were reviewed by me and considered in my medical decision making (see chart for details).  Painless plague x 6 months in gum line Overlying approximately tooth #17  This does not appear to be an infection.  It's unlikely an ulcer after 6 months of unchanged presentation.  I wonder if this may be the eruption or exposure of his wisdom tooth (?).  However, because he reports he was seen by a dentist who had other clinical concerns, I will provide him the office information for both an oral surgeon and for ENT.  I'm not sure which of their offices would perform a biopsy in the case of suspicious lesions, but I advised he called them both to discuss this by phone on Monday.  I placed a SW consult regarding his financial issues.  I'm not certain there is much that can be offered here, however, until he can arrange for insurance, or discuss with the offices his out of pocket payments.  He verbalized understanding of this plan.     Final Clinical Impression(s) / ED Diagnoses Final diagnoses:  Dental plaque  Gum lesion    Rx / DC Orders ED  Discharge Orders     None        Terald Sleeper, MD 05/12/21 1013

## 2021-05-11 NOTE — Care Management (Signed)
PCP placed on patient instructions, need to call monday

## 2021-05-11 NOTE — ED Provider Notes (Signed)
Emergency Medicine Provider Triage Evaluation Note  Brandon Byrd , a 55 y.o. male  was evaluated in triage.  Pt presents today wanting someone to inspect his left lower gumline. He has noticed a small white patch to his gumline for about 6 months. HE went to a free clinic at [facility] who took a look and told him he would likely need a biopsy for same. Pt does not have insurance and states he cannot afford to see an orthodontist prompting ED visit today. No pain. The area has not grown in size. He is not a smoker and does not chew tobacco.  Review of Systems  Positive: + white patch to gum Negative: - fevers, pain, dental pain  Physical Exam  BP (!) 149/103 (BP Location: Left Arm)   Pulse 95   Temp 98.7 F (37.1 C) (Oral)   Resp 16   SpO2 95%  Gen:   Awake, no distress   Resp:  Normal effort  MSK:   Moves extremities without difficulty  Other:  Small 0.5 x 0.5 cm white plaque to left lower gumline around wisdom tooth  Medical Decision Making  Medically screening exam initiated at 9:26 AM.  Appropriate orders placed.  Ruhan Borak was informed that the remainder of the evaluation will be completed by another provider, this initial triage assessment does not replace that evaluation, and the importance of remaining in the ED until their evaluation is complete.     Tanda Rockers, PA-C 05/11/21 2119    Maia Plan, MD 05/13/21 [phone]

## 2021-05-11 NOTE — Discharge Instructions (Addendum)
You have a lesion on your gums.  You were told by your dentist to see an oral specialist for this lesion, for a possible biopsy.  I have included phone numbers for [facility] and for Dr Ross Marcus, an oral surgeon.  I am not certain which of these two specialists can best manage this issue. Please call their offices on Monday and ask if they are able to "examine and biopsy a suspicious lesion on your gums."   They should be able to tell you over the phone if they can do this.  You can discuss your office co-pay and insurance issues with them.  I've asked our social worker to reach out to you this week to discuss your financial situation.  If you see the ENT doctor, your medical insurance should cover a visit.  If you see an oral surgeon you may need dental insurance.  You can ask their office this on the phone.

## 2021-05-11 NOTE — ED Notes (Signed)
RN went into room to discharge pt. Pt not in room, left w/o discharge instructions.

## 2021-10-10 ENCOUNTER — Other Ambulatory Visit: Payer: Self-pay | Admitting: Neurosurgery

## 2021-10-22 NOTE — Pre-Procedure Instructions (Signed)
Surgical Instructions    Your procedure is scheduled on Thursday 10/31/21.   Report to [facility] Main Entrance "A" at 10:30 A.M., then check in with the Admitting office.  Call this number if you have problems the morning of surgery:  [phone]   If you have any questions prior to your surgery date call [phone]: Open Monday-Friday 8am-4pm    Remember:  Do not eat or drink after midnight the night before your surgery     Take these medicines the morning of surgery with A SIP OF WATER   acetaminophen (TYLENOL)- If needed   As of today, STOP taking any Aspirin (unless otherwise instructed by your surgeon) Aleve, Naproxen, Ibuprofen, Motrin, Advil, Goody's, BC's, all herbal medications, fish oil, and all vitamins.  WHAT DO I DO ABOUT MY DIABETES MEDICATION?   Do not take oral diabetes medicines (pills) the morning of surgery.  The morning of surgery take 2 units of insulin glargine (TOUJEO)  The day of surgery, do not take other diabetes injectables, including Byetta (exenatide), Bydureon (exenatide ER), Victoza (liraglutide), or Trulicity (dulaglutide).  If your CBG is greater than 220 mg/dL, you may take  of your sliding scale (correction) dose of insulin.   HOW TO MANAGE YOUR DIABETES BEFORE AND AFTER SURGERY  Why is it important to control my blood sugar before and after surgery? Improving blood sugar levels before and after surgery helps healing and can limit problems. A way of improving blood sugar control is eating a healthy diet by:  Eating less sugar and carbohydrates  Increasing activity/exercise  Talking with your doctor about reaching your blood sugar goals High blood sugars (greater than 180 mg/dL) can raise your risk of infections and slow your recovery, so you will need to focus on controlling your diabetes during the weeks before surgery. Make sure that the doctor who takes care of your diabetes knows about your planned surgery including the date and  location.  How do I manage my blood sugar before surgery? Check your blood sugar at least 4 times a day, starting 2 days before surgery, to make sure that the level is not too high or low.  Check your blood sugar the morning of your surgery when you wake up and every 2 hours until you get to the Short Stay unit.  If your blood sugar is less than 70 mg/dL, you will need to treat for low blood sugar: Do not take insulin. Treat a low blood sugar (less than 70 mg/dL) with  cup of clear juice (cranberry or apple), 4 glucose tablets, OR glucose gel. Recheck blood sugar in 15 minutes after treatment (to make sure it is greater than 70 mg/dL). If your blood sugar is not greater than 70 mg/dL on recheck, call [phone] for further instructions. Report your blood sugar to the short stay nurse when you get to Short Stay.  If you are admitted to the hospital after surgery: Your blood sugar will be checked by the staff and you will probably be given insulin after surgery (instead of oral diabetes medicines) to make sure you have good blood sugar levels. The goal for blood sugar control after surgery is 80-180 mg/dL.     After your COVID test   You are not required to quarantine however you are required to wear a well-fitting mask when you are out and around people not in your household.  If your mask becomes wet or soiled, replace with a new one.  Wash your hands often  with soap and water for 20 seconds or clean your hands with an alcohol-based hand sanitizer that contains at least 60% alcohol.  Do not share personal items.  Notify your provider: if you are in close contact with someone who has COVID  or if you develop a fever of 100.4 or greater, sneezing, cough, sore throat, shortness of breath or body aches.             Do not wear jewelry or makeup Do not wear lotions, powders, perfumes/colognes, or deodorant. Do not shave 48 hours prior to surgery.  Men may shave face and neck. Do  not bring valuables to the hospital. DO Not wear nail polish, gel polish, artificial nails, or any other type of covering on natural nails including finger and toenails. If patients have artificial nails, gel coating, etc. that need to be removed by a nail salon, please have this removed prior to surgery or surgery may need to be canceled/delayed if the surgeon/ anesthesia feels like the patient is unable to be adequately monitored.             [facility] is not responsible for any belongings or valuables.  Do NOT Smoke (Tobacco/Vaping)  24 hours prior to your procedure  If you use a CPAP at night, you may bring your mask for your overnight stay.   Contacts, glasses, hearing aids, dentures or partials may not be worn into surgery, please bring cases for these belongings   For patients admitted to the hospital, discharge time will be determined by your treatment team.   Patients discharged the day of surgery will not be allowed to drive home, and someone needs to stay with them for 24 hours.  NO VISITORS WILL BE ALLOWED IN PRE-OP WHERE PATIENTS ARE PREPPED FOR SURGERY.  ONLY 1 SUPPORT PERSON MAY BE PRESENT IN THE WAITING ROOM WHILE YOU ARE IN SURGERY.  IF YOU ARE TO BE ADMITTED, ONCE YOU ARE IN YOUR ROOM YOU WILL BE ALLOWED TWO (2) VISITORS. 1 (ONE) VISITOR MAY STAY OVERNIGHT BUT MUST ARRIVE TO THE ROOM BY 8pm.  Minor children may have two parents present. Special consideration for safety and communication needs will be reviewed on a case by case basis.  Special instructions:    Oral Hygiene is also important to reduce your risk of infection.  Remember - BRUSH YOUR TEETH THE MORNING OF SURGERY WITH YOUR REGULAR TOOTHPASTE   [facility]- Preparing For Surgery  Before surgery, you can play an important role. Because skin is not sterile, your skin needs to be as free of germs as possible. You can reduce the number of germs on your skin by washing with CHG (chlorahexidine gluconate) Soap before  surgery.  CHG is an antiseptic cleaner which kills germs and bonds with the skin to continue killing germs even after washing.     Please do not use if you have an allergy to CHG or antibacterial soaps. If your skin becomes reddened/irritated stop using the CHG.  Do not shave (including legs and underarms) for at least 48 hours prior to first CHG shower. It is OK to shave your face.  Please follow these instructions carefully.     Shower the NIGHT BEFORE SURGERY and the MORNING OF SURGERY with CHG Soap.   If you chose to wash your hair, wash your hair first as usual with your normal shampoo. After you shampoo, rinse your hair and body thoroughly to remove the shampoo.  Then [employer] and genitals (  private parts) with your normal soap and rinse thoroughly to remove soap.  After that Use CHG Soap as you would any other liquid soap. You can apply CHG directly to the skin and wash gently with a scrungie or a clean washcloth.   Apply the CHG Soap to your body ONLY FROM THE NECK DOWN.  Do not use on open wounds or open sores. Avoid contact with your eyes, ears, mouth and genitals (private parts). Wash Face and genitals (private parts)  with your normal soap.   Wash thoroughly, paying special attention to the area where your surgery will be performed.  Thoroughly rinse your body with warm water from the neck down.  DO NOT shower/wash with your normal soap after using and rinsing off the CHG Soap.  Pat yourself dry with a CLEAN TOWEL.  Wear CLEAN PAJAMAS to bed the night before surgery  Place CLEAN SHEETS on your bed the night before your surgery  DO NOT SLEEP WITH PETS.   Day of Surgery:  Take a shower with CHG soap. Wear Clean/Comfortable clothing the morning of surgery Do not apply any deodorants/lotions.   Remember to brush your teeth WITH YOUR REGULAR TOOTHPASTE.   Please read over the following fact sheets that you were given.

## 2021-10-23 ENCOUNTER — Other Ambulatory Visit: Payer: Self-pay

## 2021-10-23 ENCOUNTER — Encounter (HOSPITAL_COMMUNITY): Payer: Self-pay

## 2021-10-23 ENCOUNTER — Encounter (HOSPITAL_COMMUNITY)
Admission: RE | Admit: 2021-10-23 | Discharge: 2021-10-23 | Disposition: A | Discharge status: Home / Self Care | Payer: No Typology Code available for payment source | Source: Ambulatory Visit | Attending: Neurosurgery | Admitting: Neurosurgery

## 2021-10-23 VITALS — BP 138/91 | HR 108 | Temp 98.6°F | Resp 17 | Ht 68.0 in | Wt 199.8 lb

## 2021-10-23 DIAGNOSIS — E1122 Type 2 diabetes mellitus with diabetic chronic kidney disease: Secondary | ICD-10-CM | POA: Insufficient documentation

## 2021-10-23 DIAGNOSIS — Z01818 Encounter for other preprocedural examination: Secondary | ICD-10-CM | POA: Insufficient documentation

## 2021-10-23 DIAGNOSIS — N183 Chronic kidney disease, stage 3 unspecified: Secondary | ICD-10-CM | POA: Diagnosis not present

## 2021-10-23 DIAGNOSIS — E119 Type 2 diabetes mellitus without complications: Secondary | ICD-10-CM

## 2021-10-23 LAB — CBC
HCT: 53.1 % — ABNORMAL HIGH (ref 39.0–52.0)
Hemoglobin: 17.8 g/dL — ABNORMAL HIGH (ref 13.0–17.0)
MCH: 29.6 pg (ref 26.0–34.0)
MCHC: 33.5 g/dL (ref 30.0–36.0)
MCV: 88.4 fL (ref 80.0–100.0)
Platelets: 201 10*3/uL (ref 150–400)
RBC: 6.01 MIL/uL — ABNORMAL HIGH (ref 4.22–5.81)
RDW: 13.8 % (ref 11.5–15.5)
WBC: 5.7 10*3/uL (ref 4.0–10.5)
nRBC: 0 % (ref 0.0–0.2)

## 2021-10-23 LAB — BASIC METABOLIC PANEL
Anion gap: 6 (ref 5–15)
BUN: 22 mg/dL — ABNORMAL HIGH (ref 6–20)
CO2: 26 mmol/L (ref 22–32)
Calcium: 9.6 mg/dL (ref 8.9–10.3)
Chloride: 107 mmol/L (ref 98–111)
Creatinine, Ser: 1.71 mg/dL — ABNORMAL HIGH (ref 0.61–1.24)
GFR, Estimated: 47 mL/min — ABNORMAL LOW (ref >60)
Glucose, Bld: 117 mg/dL — ABNORMAL HIGH (ref 70–99)
Potassium: 4.6 mmol/L (ref 3.5–5.1)
Sodium: 139 mmol/L (ref 135–145)

## 2021-10-23 LAB — SURGICAL PCR SCREEN
MRSA, PCR: NEGATIVE
Staphylococcus aureus: NEGATIVE

## 2021-10-23 LAB — TYPE AND SCREEN
ABO/RH(D): B POS
Antibody Screen: NEGATIVE

## 2021-10-23 LAB — HEMOGLOBIN A1C
Hgb A1c MFr Bld: 7 % — ABNORMAL HIGH (ref 4.8–5.6)
Mean Plasma Glucose: 154 mg/dL

## 2021-10-23 LAB — GLUCOSE, CAPILLARY: Glucose-Capillary: 120 mg/dL — ABNORMAL HIGH (ref 70–99)

## 2021-10-23 NOTE — Pre-Procedure Instructions (Signed)
Surgical Instructions    Your procedure is scheduled on Thursday 10/31/21.   Report to [facility] Main Entrance "A" at 10:30 A.M., then check in with the Admitting office.  Call this number if you have problems the morning of surgery:  [phone]   If you have any questions prior to your surgery date call [phone]: Open Monday-Friday 8am-4pm    Remember:  Do not eat or drink after midnight the night before your surgery     Take these medicines the morning of surgery with A SIP OF WATER   acetaminophen (TYLENOL)- If needed   As of today, STOP taking any Aspirin (unless otherwise instructed by your surgeon) Aleve, Naproxen, Ibuprofen, Motrin, Advil, Goody's, BC's, all herbal medications, fish oil, and all vitamins.  WHAT DO I DO ABOUT MY DIABETES MEDICATION?   Do not take empagliflozin (JARDIANCE) the day before surgery (12/7) or the morning of surgery (12/8).     HOW TO MANAGE YOUR DIABETES BEFORE AND AFTER SURGERY  Why is it important to control my blood sugar before and after surgery? Improving blood sugar levels before and after surgery helps healing and can limit problems. A way of improving blood sugar control is eating a healthy diet by:  Eating less sugar and carbohydrates  Increasing activity/exercise  Talking with your doctor about reaching your blood sugar goals High blood sugars (greater than 180 mg/dL) can raise your risk of infections and slow your recovery, so you will need to focus on controlling your diabetes during the weeks before surgery. Make sure that the doctor who takes care of your diabetes knows about your planned surgery including the date and location.  How do I manage my blood sugar before surgery? Check your blood sugar at least 4 times a day, starting 2 days before surgery, to make sure that the level is not too high or low.  Check your blood sugar the morning of your surgery when you wake up and every 2 hours until you get to the Short  Stay unit.  If your blood sugar is less than 70 mg/dL, you will need to treat for low blood sugar: Do not take insulin. Treat a low blood sugar (less than 70 mg/dL) with  cup of clear juice (cranberry or apple), 4 glucose tablets, OR glucose gel. Recheck blood sugar in 15 minutes after treatment (to make sure it is greater than 70 mg/dL). If your blood sugar is not greater than 70 mg/dL on recheck, call [phone] for further instructions. Report your blood sugar to the short stay nurse when you get to Short Stay.  If you are admitted to the hospital after surgery: Your blood sugar will be checked by the staff and you will probably be given insulin after surgery (instead of oral diabetes medicines) to make sure you have good blood sugar levels. The goal for blood sugar control after surgery is 80-180 mg/dL.     After your COVID test   You are not required to quarantine however you are required to wear a well-fitting mask when you are out and around people not in your household.  If your mask becomes wet or soiled, replace with a new one.  Wash your hands often with soap and water for 20 seconds or clean your hands with an alcohol-based hand sanitizer that contains at least 60% alcohol.  Do not share personal items.  Notify your provider: if you are in close contact with someone who has COVID  or if you develop  a fever of 100.4 or greater, sneezing, cough, sore throat, shortness of breath or body aches.             Do not wear jewelry  Do not wear lotions, powders, colognes, or deodorant. Men may shave face and neck. Do not bring valuables to the hospital.             [facility] is not responsible for any belongings or valuables.  Do NOT Smoke (Tobacco/Vaping)  24 hours prior to your procedure  If you use a CPAP at night, you may bring your mask for your overnight stay.   Contacts, glasses, hearing aids, dentures or partials may not be worn into surgery, please bring cases  for these belongings   For patients admitted to the hospital, discharge time will be determined by your treatment team.   Patients discharged the day of surgery will not be allowed to drive home, and someone needs to stay with them for 24 hours.  NO VISITORS WILL BE ALLOWED IN PRE-OP WHERE PATIENTS ARE PREPPED FOR SURGERY.  ONLY 1 SUPPORT PERSON MAY BE PRESENT IN THE WAITING ROOM WHILE YOU ARE IN SURGERY.  IF YOU ARE TO BE ADMITTED, ONCE YOU ARE IN YOUR ROOM YOU WILL BE ALLOWED TWO (2) VISITORS. 1 (ONE) VISITOR MAY STAY OVERNIGHT BUT MUST ARRIVE TO THE ROOM BY 8pm.  Minor children may have two parents present. Special consideration for safety and communication needs will be reviewed on a case by case basis.  Special instructions:    Oral Hygiene is also important to reduce your risk of infection.  Remember - BRUSH YOUR TEETH THE MORNING OF SURGERY WITH YOUR REGULAR TOOTHPASTE   [place]- Preparing For Surgery  Before surgery, you can play an important role. Because skin is not sterile, your skin needs to be as free of germs as possible. You can reduce the number of germs on your skin by washing with CHG (chlorahexidine gluconate) Soap before surgery.  CHG is an antiseptic cleaner which kills germs and bonds with the skin to continue killing germs even after washing.     Please do not use if you have an allergy to CHG or antibacterial soaps. If your skin becomes reddened/irritated stop using the CHG.  Do not shave (including legs and underarms) for at least 48 hours prior to first CHG shower. It is OK to shave your face.  Please follow these instructions carefully.     Shower the NIGHT BEFORE SURGERY and the MORNING OF SURGERY with CHG Soap.   If you chose to wash your hair, wash your hair first as usual with your normal shampoo. After you shampoo, rinse your hair and body thoroughly to remove the shampoo.  Then [employer] and genitals (private parts) with your normal soap and rinse  thoroughly to remove soap.  After that Use CHG Soap as you would any other liquid soap. You can apply CHG directly to the skin and wash gently with a scrungie or a clean washcloth.   Apply the CHG Soap to your body ONLY FROM THE NECK DOWN.  Do not use on open wounds or open sores. Avoid contact with your eyes, ears, mouth and genitals (private parts). Wash Face and genitals (private parts)  with your normal soap.   Wash thoroughly, paying special attention to the area where your surgery will be performed.  Thoroughly rinse your body with warm water from the neck down.  DO NOT shower/wash with your normal soap  after using and rinsing off the CHG Soap.  Pat yourself dry with a CLEAN TOWEL.  Wear CLEAN PAJAMAS to bed the night before surgery  Place CLEAN SHEETS on your bed the night before your surgery  DO NOT SLEEP WITH PETS.   Day of Surgery:  Take a shower with CHG soap. Wear Clean/Comfortable clothing the morning of surgery Do not apply any deodorants/lotions.   Remember to brush your teeth WITH YOUR REGULAR TOOTHPASTE.   Please read over the following fact sheets that you were given.

## 2021-10-23 NOTE — Progress Notes (Signed)
PCP - Charise Carwin, PA [facility]) Cardiologist - denies  PPM/ICD - denies   Chest x-ray - 05/22/12 EKG - 10/23/21 at PAT Stress Test - denies ECHO - denies Cardiac Cath - denies  Sleep Study - denies  DM- Type 2 Fasting Blood Sugar - 120-130 Checks Blood Sugar 2 times a day  Blood Thinner Instructions: n/a Aspirin Instructions: n/a  ERAS Protcol - no, NPO   COVID TEST- pt scheduled for testing on 10/28/21   Anesthesia review: no  Patient denies shortness of breath, fever, cough and chest pain at PAT appointment   All instructions explained to the patient, with a verbal understanding of the material. Patient agrees to go over the instructions while at home for a better understanding. Patient also instructed to wear a mask in public after being tested for COVID-19. The opportunity to ask questions was provided.

## 2021-10-28 ENCOUNTER — Other Ambulatory Visit (HOSPITAL_COMMUNITY)
Admission: RE | Admit: 2021-10-28 | Discharge: 2021-10-28 | Disposition: A | Discharge status: Home / Self Care | Payer: No Typology Code available for payment source | Source: Ambulatory Visit | Attending: Neurosurgery | Admitting: Neurosurgery

## 2021-10-28 DIAGNOSIS — Z20822 Contact with and (suspected) exposure to covid-19: Secondary | ICD-10-CM | POA: Diagnosis not present

## 2021-10-28 DIAGNOSIS — Z01812 Encounter for preprocedural laboratory examination: Secondary | ICD-10-CM | POA: Diagnosis present

## 2021-10-28 DIAGNOSIS — Z01818 Encounter for other preprocedural examination: Secondary | ICD-10-CM

## 2021-10-29 LAB — SARS CORONAVIRUS 2 (TAT 6-24 HRS): SARS Coronavirus 2: NEGATIVE

## 2021-10-31 ENCOUNTER — Encounter (HOSPITAL_COMMUNITY): Payer: Self-pay | Admitting: Neurosurgery

## 2021-10-31 ENCOUNTER — Ambulatory Visit (HOSPITAL_COMMUNITY): Payer: No Typology Code available for payment source

## 2021-10-31 ENCOUNTER — Other Ambulatory Visit: Payer: Self-pay

## 2021-10-31 ENCOUNTER — Observation Stay (HOSPITAL_COMMUNITY)
Admission: RE | Admit: 2021-10-31 | Discharge: 2021-11-01 | Disposition: A | Discharge status: Home / Self Care | Payer: No Typology Code available for payment source | Attending: Neurosurgery | Admitting: Neurosurgery

## 2021-10-31 ENCOUNTER — Encounter (HOSPITAL_COMMUNITY)
Admission: RE | Disposition: A | Discharge status: Home / Self Care | Payer: Self-pay | Source: Home / Self Care | Attending: Neurosurgery

## 2021-10-31 ENCOUNTER — Ambulatory Visit (HOSPITAL_COMMUNITY): Payer: No Typology Code available for payment source | Admitting: General Practice

## 2021-10-31 DIAGNOSIS — M4316 Spondylolisthesis, lumbar region: Secondary | ICD-10-CM | POA: Insufficient documentation

## 2021-10-31 DIAGNOSIS — N183 Chronic kidney disease, stage 3 unspecified: Secondary | ICD-10-CM | POA: Insufficient documentation

## 2021-10-31 DIAGNOSIS — Z794 Long term (current) use of insulin: Secondary | ICD-10-CM | POA: Diagnosis not present

## 2021-10-31 DIAGNOSIS — Z79899 Other long term (current) drug therapy: Secondary | ICD-10-CM | POA: Diagnosis not present

## 2021-10-31 DIAGNOSIS — M48061 Spinal stenosis, lumbar region without neurogenic claudication: Principal | ICD-10-CM | POA: Insufficient documentation

## 2021-10-31 DIAGNOSIS — Z419 Encounter for procedure for purposes other than remedying health state, unspecified: Secondary | ICD-10-CM

## 2021-10-31 DIAGNOSIS — E1122 Type 2 diabetes mellitus with diabetic chronic kidney disease: Secondary | ICD-10-CM | POA: Insufficient documentation

## 2021-10-31 DIAGNOSIS — Z7982 Long term (current) use of aspirin: Secondary | ICD-10-CM | POA: Diagnosis not present

## 2021-10-31 LAB — ABO/RH: ABO/RH(D): B POS

## 2021-10-31 LAB — GLUCOSE, CAPILLARY
Glucose-Capillary: 131 mg/dL — ABNORMAL HIGH (ref 70–99)
Glucose-Capillary: 185 mg/dL — ABNORMAL HIGH (ref 70–99)
Glucose-Capillary: 249 mg/dL — ABNORMAL HIGH (ref 70–99)

## 2021-10-31 SURGERY — POSTERIOR LUMBAR FUSION 1 LEVEL
Anesthesia: General

## 2021-10-31 MED ORDER — DEXAMETHASONE SODIUM PHOSPHATE 10 MG/ML IJ SOLN
INTRAMUSCULAR | Status: DC | PRN
  Administered 2021-10-31: 10 mg via INTRAVENOUS

## 2021-10-31 MED ORDER — OXYCODONE HCL 5 MG PO TABS: 5.0000 mg | ORAL_TABLET | ORAL | Status: DC | PRN

## 2021-10-31 MED ORDER — MENTHOL 3 MG MT LOZG: 1.0000 | LOZENGE | OROMUCOSAL | Status: DC | PRN

## 2021-10-31 MED ORDER — PHENYLEPHRINE HCL-NACL 20-0.9 MG/250ML-% IV SOLN
INTRAVENOUS | Status: DC | PRN
  Administered 2021-10-31: 30 ug/min via INTRAVENOUS

## 2021-10-31 MED ORDER — ESMOLOL HCL 100 MG/10ML IV SOLN
INTRAVENOUS | Status: DC | PRN
  Administered 2021-10-31: 20 mg via INTRAVENOUS

## 2021-10-31 MED ORDER — THROMBIN 5000 UNITS EX SOLR
OROMUCOSAL | Status: DC | PRN
  Administered 2021-10-31: 5 mL via TOPICAL

## 2021-10-31 MED ORDER — ESMOLOL HCL 100 MG/10ML IV SOLN
INTRAVENOUS | Status: AC
  Filled 2021-10-31: qty 10

## 2021-10-31 MED ORDER — CHLORHEXIDINE GLUCONATE 0.12 % MT SOLN
OROMUCOSAL | Status: AC
  Administered 2021-10-31: 15 mL via OROMUCOSAL
  Filled 2021-10-31: qty 15

## 2021-10-31 MED ORDER — ONDANSETRON HCL 4 MG/2ML IJ SOLN
INTRAMUSCULAR | Status: DC | PRN
  Administered 2021-10-31: 4 mg via INTRAVENOUS

## 2021-10-31 MED ORDER — SODIUM CHLORIDE 0.9 % IV SOLN: 250.0000 mL | INTRAVENOUS | Status: DC

## 2021-10-31 MED ORDER — PANTOPRAZOLE SODIUM 40 MG IV SOLR: 40.0000 mg | Freq: Every day | INTRAVENOUS | Status: DC

## 2021-10-31 MED ORDER — HYDROXYZINE HCL 50 MG/ML IM SOLN
50.0000 mg | Freq: Four times a day (QID) | INTRAMUSCULAR | Status: DC | PRN
  Administered 2021-10-31: 50 mg via INTRAMUSCULAR
  Filled 2021-10-31: qty 1

## 2021-10-31 MED ORDER — ACETAMINOPHEN 650 MG RE SUPP: 650.0000 mg | RECTAL | Status: DC | PRN

## 2021-10-31 MED ORDER — CEFAZOLIN SODIUM-DEXTROSE 2-4 GM/100ML-% IV SOLN
2.0000 g | Freq: Three times a day (TID) | INTRAVENOUS | Status: AC
  Administered 2021-10-31 – 2021-11-01 (×2): 2 g via INTRAVENOUS
  Filled 2021-10-31 (×2): qty 100

## 2021-10-31 MED ORDER — METHOCARBAMOL 1000 MG/10ML IJ SOLN: 500.0000 mg | Freq: Four times a day (QID) | INTRAVENOUS | Status: DC | PRN

## 2021-10-31 MED ORDER — BUPIVACAINE HCL (PF) 0.5 % IJ SOLN
INTRAMUSCULAR | Status: DC | PRN
  Administered 2021-10-31: 5 mL

## 2021-10-31 MED ORDER — PROPOFOL 10 MG/ML IV BOLUS
INTRAVENOUS | Status: DC | PRN
  Administered 2021-10-31: 100 mg via INTRAVENOUS

## 2021-10-31 MED ORDER — SODIUM CHLORIDE 0.9% FLUSH: 3.0000 mL | INTRAVENOUS | Status: DC | PRN

## 2021-10-31 MED ORDER — BISACODYL 10 MG RE SUPP: 10.0000 mg | Freq: Every day | RECTAL | Status: DC | PRN

## 2021-10-31 MED ORDER — SODIUM CHLORIDE 0.9 % IV SOLN: INTRAVENOUS | Status: DC

## 2021-10-31 MED ORDER — LIDOCAINE-EPINEPHRINE 1 %-1:100000 IJ SOLN
INTRAMUSCULAR | Status: DC | PRN
  Administered 2021-10-31: 5 mL

## 2021-10-31 MED ORDER — SUGAMMADEX SODIUM 200 MG/2ML IV SOLN
INTRAVENOUS | Status: DC | PRN
  Administered 2021-10-31: 200 mg via INTRAVENOUS

## 2021-10-31 MED ORDER — CHLORHEXIDINE GLUCONATE CLOTH 2 % EX PADS: 6.0000 | MEDICATED_PAD | Freq: Once | CUTANEOUS | Status: DC

## 2021-10-31 MED ORDER — DEXAMETHASONE SODIUM PHOSPHATE 10 MG/ML IJ SOLN
INTRAMUSCULAR | Status: AC
  Filled 2021-10-31: qty 1

## 2021-10-31 MED ORDER — ACETAMINOPHEN 325 MG PO TABS
650.0000 mg | ORAL_TABLET | ORAL | Status: DC | PRN
  Administered 2021-10-31 – 2021-11-01 (×2): 650 mg via ORAL
  Filled 2021-10-31 (×2): qty 2

## 2021-10-31 MED ORDER — ONDANSETRON HCL 4 MG PO TABS: 4.0000 mg | ORAL_TABLET | Freq: Four times a day (QID) | ORAL | Status: DC | PRN

## 2021-10-31 MED ORDER — INSULIN ASPART 100 UNIT/ML IJ SOLN: 0.0000 [IU] | Freq: Three times a day (TID) | INTRAMUSCULAR | Status: DC

## 2021-10-31 MED ORDER — LIDOCAINE 2% (20 MG/ML) 5 ML SYRINGE
INTRAMUSCULAR | Status: AC
  Filled 2021-10-31: qty 5

## 2021-10-31 MED ORDER — CEFAZOLIN SODIUM-DEXTROSE 2-4 GM/100ML-% IV SOLN
INTRAVENOUS | Status: AC
  Filled 2021-10-31: qty 100

## 2021-10-31 MED ORDER — 0.9 % SODIUM CHLORIDE (POUR BTL) OPTIME
TOPICAL | Status: DC | PRN
  Administered 2021-10-31: 1000 mL

## 2021-10-31 MED ORDER — PHENOL 1.4 % MT LIQD: 1.0000 | OROMUCOSAL | Status: DC | PRN

## 2021-10-31 MED ORDER — MIDAZOLAM HCL 2 MG/2ML IJ SOLN
INTRAMUSCULAR | Status: DC | PRN
  Administered 2021-10-31: 2 mg via INTRAVENOUS

## 2021-10-31 MED ORDER — MORPHINE SULFATE (PF) 2 MG/ML IV SOLN
2.0000 mg | INTRAVENOUS | Status: DC | PRN
  Administered 2021-10-31: 2 mg via INTRAVENOUS
  Filled 2021-10-31: qty 1

## 2021-10-31 MED ORDER — ORAL CARE MOUTH RINSE: 15.0000 mL | Freq: Once | OROMUCOSAL | Status: AC

## 2021-10-31 MED ORDER — LIDOCAINE-EPINEPHRINE 1 %-1:100000 IJ SOLN
INTRAMUSCULAR | Status: AC
  Filled 2021-10-31: qty 1

## 2021-10-31 MED ORDER — EMPAGLIFLOZIN 10 MG PO TABS
10.0000 mg | ORAL_TABLET | Freq: Every day | ORAL | Status: DC
  Administered 2021-11-01: 10 mg via ORAL
  Filled 2021-10-31: qty 1

## 2021-10-31 MED ORDER — B COMPLEX-C PO TABS
1.0000 | ORAL_TABLET | ORAL | Status: DC
Start: 2021-11-01 — End: 2021-11-01
  Administered 2021-11-01: 1 via ORAL
  Filled 2021-10-31: qty 1

## 2021-10-31 MED ORDER — INSULIN ASPART 100 UNIT/ML IJ SOLN
0.0000 [IU] | Freq: Three times a day (TID) | INTRAMUSCULAR | Status: DC
  Administered 2021-11-01: 4 [IU] via SUBCUTANEOUS

## 2021-10-31 MED ORDER — INSULIN ASPART 100 UNIT/ML IJ SOLN
0.0000 [IU] | Freq: Every day | INTRAMUSCULAR | Status: DC
  Administered 2021-10-31: 2 [IU] via SUBCUTANEOUS

## 2021-10-31 MED ORDER — CEFAZOLIN SODIUM-DEXTROSE 2-4 GM/100ML-% IV SOLN
2.0000 g | INTRAVENOUS | Status: AC
  Administered 2021-10-31: 2 g via INTRAVENOUS

## 2021-10-31 MED ORDER — PANTOPRAZOLE SODIUM 40 MG PO TBEC
40.0000 mg | DELAYED_RELEASE_TABLET | Freq: Every day | ORAL | Status: DC
  Administered 2021-10-31: 40 mg via ORAL
  Filled 2021-10-31: qty 1

## 2021-10-31 MED ORDER — LIDOCAINE 2% (20 MG/ML) 5 ML SYRINGE
INTRAMUSCULAR | Status: DC | PRN
  Administered 2021-10-31: 60 mg via INTRAVENOUS

## 2021-10-31 MED ORDER — BUPIVACAINE HCL (PF) 0.5 % IJ SOLN
INTRAMUSCULAR | Status: AC
  Filled 2021-10-31: qty 30

## 2021-10-31 MED ORDER — ONDANSETRON HCL 4 MG/2ML IJ SOLN
INTRAMUSCULAR | Status: AC
  Filled 2021-10-31: qty 2

## 2021-10-31 MED ORDER — DEXMEDETOMIDINE (PRECEDEX) IN NS 20 MCG/5ML (4 MCG/ML) IV SYRINGE
PREFILLED_SYRINGE | INTRAVENOUS | Status: DC | PRN
  Administered 2021-10-31 (×2): 8 ug via INTRAVENOUS
  Administered 2021-10-31: 4 ug via INTRAVENOUS

## 2021-10-31 MED ORDER — SODIUM CHLORIDE 0.9% FLUSH: 3.0000 mL | Freq: Two times a day (BID) | INTRAVENOUS | Status: DC

## 2021-10-31 MED ORDER — SENNA 8.6 MG PO TABS
1.0000 | ORAL_TABLET | Freq: Two times a day (BID) | ORAL | Status: DC
  Administered 2021-10-31 – 2021-11-01 (×2): 8.6 mg via ORAL
  Filled 2021-10-31 (×2): qty 1

## 2021-10-31 MED ORDER — ROCURONIUM BROMIDE 10 MG/ML (PF) SYRINGE
PREFILLED_SYRINGE | INTRAVENOUS | Status: AC
  Filled 2021-10-31: qty 10

## 2021-10-31 MED ORDER — PROPOFOL 10 MG/ML IV BOLUS
INTRAVENOUS | Status: AC
  Filled 2021-10-31: qty 20

## 2021-10-31 MED ORDER — FENTANYL CITRATE (PF) 250 MCG/5ML IJ SOLN
INTRAMUSCULAR | Status: DC | PRN
  Administered 2021-10-31 (×5): 50 ug via INTRAVENOUS

## 2021-10-31 MED ORDER — CHLORHEXIDINE GLUCONATE 0.12 % MT SOLN: 15.0000 mL | Freq: Once | OROMUCOSAL | Status: AC

## 2021-10-31 MED ORDER — B COMPLEX VITAMINS PO CAPS: 1.0000 | ORAL_CAPSULE | ORAL | Status: DC

## 2021-10-31 MED ORDER — FENTANYL CITRATE (PF) 100 MCG/2ML IJ SOLN
INTRAMUSCULAR | Status: AC
  Filled 2021-10-31: qty 2

## 2021-10-31 MED ORDER — METHOCARBAMOL 500 MG PO TABS
500.0000 mg | ORAL_TABLET | Freq: Four times a day (QID) | ORAL | Status: DC | PRN
  Administered 2021-10-31: 500 mg via ORAL
  Filled 2021-10-31 (×2): qty 1

## 2021-10-31 MED ORDER — FENTANYL CITRATE (PF) 100 MCG/2ML IJ SOLN
25.0000 ug | INTRAMUSCULAR | Status: DC | PRN
  Administered 2021-10-31: 50 ug via INTRAVENOUS

## 2021-10-31 MED ORDER — DOCUSATE SODIUM 100 MG PO CAPS
100.0000 mg | ORAL_CAPSULE | Freq: Two times a day (BID) | ORAL | Status: DC
  Administered 2021-10-31 – 2021-11-01 (×2): 100 mg via ORAL
  Filled 2021-10-31 (×2): qty 1

## 2021-10-31 MED ORDER — OXYCODONE HCL 5 MG PO TABS
10.0000 mg | ORAL_TABLET | ORAL | Status: DC | PRN
  Administered 2021-10-31 – 2021-11-01 (×3): 10 mg via ORAL
  Filled 2021-10-31 (×4): qty 2

## 2021-10-31 MED ORDER — FENTANYL CITRATE (PF) 250 MCG/5ML IJ SOLN
INTRAMUSCULAR | Status: AC
  Filled 2021-10-31: qty 5

## 2021-10-31 MED ORDER — THROMBIN 5000 UNITS EX SOLR
CUTANEOUS | Status: AC
  Filled 2021-10-31: qty 5000

## 2021-10-31 MED ORDER — LACTATED RINGERS IV SOLN: INTRAVENOUS | Status: DC

## 2021-10-31 MED ORDER — ROCURONIUM BROMIDE 10 MG/ML (PF) SYRINGE
PREFILLED_SYRINGE | INTRAVENOUS | Status: DC | PRN
  Administered 2021-10-31 (×2): 20 mg via INTRAVENOUS
  Administered 2021-10-31: 100 mg via INTRAVENOUS

## 2021-10-31 MED ORDER — PHENYLEPHRINE 40 MCG/ML (10ML) SYRINGE FOR IV PUSH (FOR BLOOD PRESSURE SUPPORT)
PREFILLED_SYRINGE | INTRAVENOUS | Status: DC | PRN
  Administered 2021-10-31 (×2): 80 ug via INTRAVENOUS
  Administered 2021-10-31 (×2): 40 ug via INTRAVENOUS

## 2021-10-31 MED ORDER — ONDANSETRON HCL 4 MG/2ML IJ SOLN
4.0000 mg | Freq: Four times a day (QID) | INTRAMUSCULAR | Status: DC | PRN
  Administered 2021-10-31: 4 mg via INTRAVENOUS

## 2021-10-31 MED ORDER — MIDAZOLAM HCL 2 MG/2ML IJ SOLN
INTRAMUSCULAR | Status: AC
  Filled 2021-10-31: qty 2

## 2021-10-31 SURGICAL SUPPLY — 67 items
BAG COUNTER SPONGE SURGICOUNT (BAG) ×4 IMPLANT
BASKET BONE COLLECTION (BASKET) ×2 IMPLANT
BENZOIN TINCTURE PRP APPL 2/3 (GAUZE/BANDAGES/DRESSINGS) IMPLANT
BLADE CLIPPER SURG (BLADE) IMPLANT
BLADE SURG 11 STRL SS (BLADE) ×2 IMPLANT
BUR MATCHSTICK NEURO 3.0 LAGG (BURR) ×2 IMPLANT
BUR PRECISION FLUTE 5.0 (BURR) ×2 IMPLANT
CAGE INTERBODY PL LG 7X26.5X24 (Cage) ×4 IMPLANT
CANISTER SUCT 3000ML PPV (MISCELLANEOUS) ×2 IMPLANT
CARTRIDGE OIL MAESTRO DRILL (MISCELLANEOUS) ×1 IMPLANT
CNTNR URN SCR LID CUP LEK RST (MISCELLANEOUS) ×1 IMPLANT
CONT SPEC 4OZ STRL OR WHT (MISCELLANEOUS) ×2
COVER BACK TABLE 60X90IN (DRAPES) ×2 IMPLANT
DECANTER SPIKE VIAL GLASS SM (MISCELLANEOUS) ×2 IMPLANT
DERMABOND ADVANCED (GAUZE/BANDAGES/DRESSINGS) ×1
DERMABOND ADVANCED .7 DNX12 (GAUZE/BANDAGES/DRESSINGS) ×1 IMPLANT
DIFFUSER DRILL AIR PNEUMATIC (MISCELLANEOUS) ×2 IMPLANT
DRAPE C-ARM 42X72 X-RAY (DRAPES) ×2 IMPLANT
DRAPE C-ARMOR (DRAPES) ×2 IMPLANT
DRAPE LAPAROTOMY 100X72X124 (DRAPES) ×2 IMPLANT
DRAPE SURG 17X23 STRL (DRAPES) ×2 IMPLANT
DRSG OPSITE POSTOP 4X8 (GAUZE/BANDAGES/DRESSINGS) ×2 IMPLANT
DURAPREP 26ML APPLICATOR (WOUND CARE) ×2 IMPLANT
ELECT REM PT RETURN 9FT ADLT (ELECTROSURGICAL) ×2
ELECTRODE REM PT RTRN 9FT ADLT (ELECTROSURGICAL) ×1 IMPLANT
GAUZE 4X4 16PLY ~~LOC~~+RFID DBL (SPONGE) ×2 IMPLANT
GAUZE SPONGE 4X4 12PLY STRL (GAUZE/BANDAGES/DRESSINGS) IMPLANT
GLOVE EXAM NITRILE XL STR (GLOVE) IMPLANT
GLOVE SURG ENC MOIS LTX SZ7.5 (GLOVE) IMPLANT
GLOVE SURG LTX SZ7 (GLOVE) ×4 IMPLANT
GLOVE SURG UNDER POLY LF SZ7.5 (GLOVE) ×4 IMPLANT
GOWN STRL REUS W/ TWL LRG LVL3 (GOWN DISPOSABLE) ×4 IMPLANT
GOWN STRL REUS W/ TWL XL LVL3 (GOWN DISPOSABLE) IMPLANT
GOWN STRL REUS W/TWL 2XL LVL3 (GOWN DISPOSABLE) IMPLANT
GOWN STRL REUS W/TWL LRG LVL3 (GOWN DISPOSABLE) ×8
GOWN STRL REUS W/TWL XL LVL3 (GOWN DISPOSABLE)
GRAFT BONE PROTEIOS XS 0.5CC (Orthopedic Implant) ×2 IMPLANT
HEMOSTAT POWDER KIT SURGIFOAM (HEMOSTASIS) ×2 IMPLANT
KIT BASIN OR (CUSTOM PROCEDURE TRAY) ×2 IMPLANT
KIT POSITION SURG JACKSON T1 (MISCELLANEOUS) ×2 IMPLANT
KIT TURNOVER KIT B (KITS) ×2 IMPLANT
MILL MEDIUM DISP (BLADE) ×2 IMPLANT
NEEDLE HYPO 18GX1.5 BLUNT FILL (NEEDLE) ×2 IMPLANT
NEEDLE HYPO 22GX1.5 SAFETY (NEEDLE) ×2 IMPLANT
NEEDLE SPNL 18GX3.5 QUINCKE PK (NEEDLE) ×2 IMPLANT
NS IRRIG 1000ML POUR BTL (IV SOLUTION) ×2 IMPLANT
OIL CARTRIDGE MAESTRO DRILL (MISCELLANEOUS) ×2
PACK LAMINECTOMY NEURO (CUSTOM PROCEDURE TRAY) ×2 IMPLANT
PAD ARMBOARD 7.5X6 YLW CONV (MISCELLANEOUS) ×6 IMPLANT
PENCIL BUTTON HOLSTER BLD 10FT (ELECTRODE) ×2 IMPLANT
PUTTY GRAFTON DBF 6CC W/DELIVE (Putty) ×2 IMPLANT
ROD CC 30MM (Rod) ×4 IMPLANT
SCREW MAS 6.5X30 (Screw) ×4 IMPLANT
SCREW SET SOLERA (Screw) ×8 IMPLANT
SCREW SET SOLERA TI (Screw) ×4 IMPLANT
SCREW SOLERA 6.5X35 (Screw) ×4 IMPLANT
SPONGE SURGIFOAM ABS GEL 100 (HEMOSTASIS) IMPLANT
SPONGE T-LAP 4X18 ~~LOC~~+RFID (SPONGE) ×2 IMPLANT
STRIP CLOSURE SKIN 1/2X4 (GAUZE/BANDAGES/DRESSINGS) IMPLANT
SUT VIC AB 0 CT1 18XCR BRD8 (SUTURE) ×2 IMPLANT
SUT VIC AB 0 CT1 8-18 (SUTURE) ×4
SUT VICRYL 3-0 RB1 18 ABS (SUTURE) ×4 IMPLANT
SYR 3ML LL SCALE MARK (SYRINGE) ×6 IMPLANT
TOWEL GREEN STERILE (TOWEL DISPOSABLE) ×2 IMPLANT
TOWEL GREEN STERILE FF (TOWEL DISPOSABLE) ×2 IMPLANT
TRAY FOLEY MTR SLVR 16FR STAT (SET/KITS/TRAYS/PACK) ×2 IMPLANT
WATER STERILE IRR 1000ML POUR (IV SOLUTION) ×2 IMPLANT

## 2021-10-31 NOTE — H&P (Signed)
Chief Complaint  Back and leg pain  History of Present Illness  Brandon Byrd is a 55 y.o. male initially seen in the outpatient clinic for back and leg pain. Patient states that the pain started nearly three and half years ago without any identifiable inciting event.  He describes some component of back pain worsened whenever he is in a single position for a long period of time.  His primary complaint however is right-sided leg pain, numbness, and tingling involving the posterolateral aspect of his right thigh, leg, and paresthesias of the right foot.  He says the symptoms occur whenever he stands or walks for more than a few minutes.  Over the last several years the symptoms have progressively worsened.  He actually now does describe some paresthesias in the left foot as well again, worse when standing or walking for a few minutes.  Over the years he has been on multiple different medications including anti-inflammatories and gabapentin.  At this point he takes primarily Tylenol on a prn basis.  He has also undergone chiropractic manipulations without significant improvement.  He has also undergone a series of two epidural steroid injections.  The first provided near complete resolution of his symptoms for nearly eight months.  He underwent a second epidural steroid injection and unfortunately only had relief for a few days before complete relapse of the pain.  Past Medical History   Past Medical History:  Diagnosis Date   Chronic back pain    CKD (chronic kidney disease) stage 3, GFR 30-59 ml/min (HCC)    Diabetes mellitus (HCC) 2021    Past Surgical History   Past Surgical History:  Procedure Laterality Date   COLONOSCOPY  2017   [facility]    Social History   Social History   Tobacco Use   Smoking status: Never   Smokeless tobacco: Never  Vaping Use   Vaping Use: Never used  Substance Use Topics   Alcohol use: No   Drug use: No     Medications   Prior to Admission medications   Medication Sig Start Date End Date Taking? Authorizing Provider  b complex vitamins capsule Take 0.5 capsules by mouth 3 (three) times a week.   Yes [provider]  empagliflozin (JARDIANCE) 25 MG TABS tablet Take 12.5 mg by mouth daily. 10/14/21  Yes [provider]  VITAMIN D, CHOLECALCIFEROL, PO Take 1 capsule by mouth 4 (four) times a week.   Yes [provider]  aspirin EC 81 MG tablet Take 1 tablet (81 mg total) by mouth daily. Patient not taking: Reported on 05/11/2021 12/13/20   Tysinger, Kermit Balo, PA-C  insulin glargine, 1 Unit Dial, (TOUJEO) 300 UNIT/ML Solostar Pen Inject 5 Units into the skin daily. Patient not taking: Reported on 10/22/2021 12/13/20   Tysinger, Kermit Balo, PA-C  rosuvastatin (CRESTOR) 10 MG tablet Take 1 tablet (10 mg total) by mouth daily. Patient not taking: Reported on 05/11/2021 12/13/20 12/13/21  Tysinger, Kermit Balo, PA-C  valsartan (DIOVAN) 40 MG tablet TAKE 1 TABLET BY MOUTH EVERY DAY Patient not taking: Reported on 05/11/2021 03/04/21   Tysinger, Kermit Balo, PA-C    Allergies  No Known Allergies  Review of Systems  ROS  Neurologic Exam  Awake, alert, oriented Memory and concentration grossly intact Speech fluent, appropriate CN grossly intact Motor exam: Upper Extremities Deltoid Bicep Tricep Grip  Right 5/5 5/5 5/5 5/5  Left 5/5 5/5 5/5 5/5   Lower Extremities IP  Quad PF DF EHL  Right 5/5 5/5 5/5 5/5 5/5  Left 5/5 5/5 5/5 5/5 5/5   Sensation grossly intact to LT  Imaging  MRI of the lumbar spine dated 11/22/2020 was personally reviewed.  This does demonstrate some disc desiccation and loss of height worst at L5-S1 with reactive endplate change.  Primary findings at L4-5 where there is grade 1 anterolisthesis.  There is associated relatively broad right eccentric disc protrusion.  There is severe bilateral facet arthropathy and significant ligamentous hypertrophy which  contributes to severe central and lateral recess stenosis.  Impression  - 55 y.o. 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  Plan  - Will plan on proceeding with decompression and fusion at L4-5.  I have reviewed the details of surgery as well as the expected postoperative course and recovery with the patient in the office.  We have discussed the associated risks, benefits, and alternatives to surgery.  All his questions today were answered and informed consent was obtained.   Lisbeth Renshaw, MD [facility]

## 2021-10-31 NOTE — Transfer of Care (Signed)
Immediate Anesthesia Transfer of Care Note  Patient: Brandon Byrd  Procedure(s) Performed: Posterior Lumbar Interbody Fusion Lumbar Four-Five  Patient Location: PACU  Anesthesia Type:General  Level of Consciousness: awake and drowsy  Airway & Oxygen Therapy: Patient Spontanous Breathing  Post-op Assessment: Report given to RN and Post -op Vital signs reviewed and stable  Post vital signs: Reviewed and stable  Last Vitals:  Vitals Value Taken Time  BP 139/96 10/31/21 1619  Temp    Pulse 103 10/31/21 1620  Resp 17 10/31/21 1620  SpO2 97 % 10/31/21 1620  Vitals shown include unvalidated device data.  Last Pain:  Vitals:   10/31/21 1051  TempSrc:   PainSc: 0-No pain         Complications: No notable events documented.

## 2021-10-31 NOTE — Anesthesia Preprocedure Evaluation (Addendum)
Anesthesia Evaluation  Patient identified by MRN, date of birth, ID band Patient awake    Reviewed: Allergy & Precautions, NPO status , Patient's Chart, lab work & pertinent test results  Airway Mallampati: II  TM Distance: >3 FB     Dental   Pulmonary neg pulmonary ROS,    breath sounds clear to auscultation       Cardiovascular negative cardio ROS   Rhythm:Regular Rate:Normal     Neuro/Psych History noted Dr. Chilton Si    GI/Hepatic negative GI ROS, Neg liver ROS,   Endo/Other  diabetes  Renal/GU Renal disease     Musculoskeletal   Abdominal   Peds  Hematology   Anesthesia Other Findings   Reproductive/Obstetrics                           Anesthesia Physical Anesthesia Plan  ASA: 3  Anesthesia Plan: General   Post-op Pain Management:    Induction: Intravenous  PONV Risk Score and Plan: 2 and Ondansetron, Dexamethasone and Midazolam  Airway Management Planned: Oral ETT  Additional Equipment:   Intra-op Plan:   Post-operative Plan: Extubation in OR  Informed Consent: I have reviewed the patients History and Physical, chart, labs and discussed the procedure including the risks, benefits and alternatives for the proposed anesthesia with the patient or authorized representative who has indicated his/her understanding and acceptance.     Dental advisory given  Plan Discussed with: CRNA and Anesthesiologist  Anesthesia Plan Comments:         Anesthesia Quick Evaluation

## 2021-10-31 NOTE — Progress Notes (Signed)
Orthopedic Tech Progress Note Patient Details:  Brandon Byrd 07-Oct-1966 [id number] Left in room for patient Ortho Devices Type of Ortho Device: Lumbar corsett Ortho Device/Splint Interventions: Ordered      Bella Kennedy A Westin Knotts 10/31/2021, 5:11 PM

## 2021-10-31 NOTE — Op Note (Signed)
NEUROSURGERY OPERATIVE NOTE   PREOP DIAGNOSIS:  1. Lumbar spondylolisthesis, L4-5 2. Lumbar spinal stenosis, L4-5  POSTOP DIAGNOSIS: Same  PROCEDURE: 1. L4 laminectomy with facetectomy for decompression of exiting nerve roots, more than would be required for placement of interbody graft 2. Placement of anterior interbody device - [employer] 9mm x 48mm expandable cage x2 3. Posterior non-segmental instrumentation using cortical pedicle screws at L4 - L5 4. Interbody arthrodesis, L4-5 5. Use of locally harvested bone autograft 6. Use of non-structural bone allograft - dbm, ProteiOs  SURGEON: Dr. Lisbeth Renshaw, MD  ASSISTANT: Dr. Tressie Stalker, MD  ANESTHESIA: General Endotracheal  EBL: 150cc  SPECIMENS: None  DRAINS: None  COMPLICATIONS: None immediate  CONDITION: Hemodynamically stable to PACU  HISTORY: Brandon Byrd is a 55 y.o. male who has been followed in the outpatient clinic with back and leg pain related to spondylosis, spondylolisthesis, and spinal stenosis at L4-5. Multiple conservative treatments were attempted without significant improvement and we ultimately elected to proceed with surgical decompression and fusion.  We reviewed the details of the surgery as well as the expected postoperative course and recovery.  We discussed the risks of the procedure.  All his questions today were answered.  He provided informed consent to proceed.  PROCEDURE IN DETAIL: The patient was brought to the operating room via stretcher. After induction of general anesthesia, the patient was positioned on the operative table in the prone position. All pressure points were meticulously padded. Incision was then marked out and prepped and draped in the usual sterile fashion.  After timeout was conducted, skin was infiltrated with local anesthetic.  Spinal needle was then introduced and lateral fluoroscopy was taken to identify the surface projection of the L4-5 interspace.  Skin  incision was then made sharply and Bovie electrocautery was used to dissect the subcutaneous tissue until the lumbodorsal fascia was identified and incised. The muscle was then elevated in the subperiosteal plane and the L4 lamina and L4-5 facet complexes were identified. Self-retaining retractors were then placed. Lateral fluoroscopy was taken with a dissector in the L4-5 interspace to confirm our location.  At this point attention was turned to decompression. Complete L4 laminectomy was completed with a high-speed drill and Kerrison punches.  Normal dura was identified.  A ball-tipped dissector was then used to identify the foramina bilaterally.  High-speed drill was used to cut across the pars interarticularis and the inferior articulating process of L4 was removed bilaterally.  Of note, there was severe facet arthropathy bilaterally, with significant medial ingrowth of the facet complex compressing the thecal sac and the L5 nerve roots bilaterally.  Once the inferior articulating process of L4 was removed, Kerrison punches were used to remove the medial and superior aspect of the L5 superior articulating process.  This allowed good decompression of the thecal sac and the L5 nerve roots, as well as the exiting L4 nerve roots.  Disc space was then identified laterally, incised bilaterally, and using a combination of shavers, curettes and rongeurs, complete discectomy was completed. Endplates were prepared with curettes, and bone harvested during decompression was mixed with ProteiOs and DBM and packed into the interspace. A 7 mm expandable cage was tapped into place bilaterally. Cages were expanded to achieve good endplate apposition and maintained lumbar lordosis.  Good position was confirmed with fluoroscopy.  At this point, the entry points for bilateral L4 and L5 cortical pedicle screws were identified using standard anatomic landmarks and lateral fluoro. Pilot holes were then drilled and tapped to  5.5 x  35 mm at L4 and 5.5 x 30 mm at L5. Screws were then placed in L4 and L5 measuring 6.5 x 35 mm at L4 and 6.5 x 30 mm at L5. Prebent 30 mm lordotic rod was then sized and placed into the pedicle screws. Set screws were placed and final tightened. Final AP and lateral fluoroscopic images confirmed good position.  Hemostasis was secured and confirmed with bipolar cautery and morcellized gelfoam with thrombin. The wound was then irrigated with copious amounts of antibiotic saline, then closed in standard fashion using a combination of interrupted 0 and 3-0 Vicryl stitches in the muscular, fascial, and subcutaneous layers. Skin was then closed using standard Dermabond. Sterile dressing was then applied. The patient was then transferred to the stretcher, extubated, and taken to the postanesthesia care unit in stable hemodynamic condition.  At the end of the case all sponge, needle, cottonoid, and instrument counts were correct.   Lisbeth Renshaw, MD [facility]

## 2021-10-31 NOTE — Anesthesia Procedure Notes (Signed)
Procedure Name: Intubation Date/Time: 10/31/2021 1:10 PM Performed by: Ardyth Harps, CRNA Pre-anesthesia Checklist: Patient identified, Emergency Drugs available, Suction available and Patient being monitored Patient Re-evaluated:Patient Re-evaluated prior to induction Oxygen Delivery Method: Circle System Utilized Preoxygenation: Pre-oxygenation with 100% oxygen Induction Type: IV induction Ventilation: Mask ventilation without difficulty Laryngoscope Size: Mac and 4 Grade View: Grade II Tube type: Oral Tube size: 7.5 mm Number of attempts: 1 Airway Equipment and Method: Stylet and Oral airway Placement Confirmation: ETT inserted through vocal cords under direct vision, positive ETCO2 and breath sounds checked- equal and bilateral Secured at: 21 cm Tube secured with: Tape Dental Injury: Teeth and Oropharynx as per pre-operative assessment

## 2021-10-31 NOTE — Anesthesia Postprocedure Evaluation (Signed)
Anesthesia Post Note  Patient: Brandon Byrd  Procedure(s) Performed: Posterior Lumbar Interbody Fusion Lumbar Four-Five     Patient location during evaluation: PACU Anesthesia Type: General Level of consciousness: awake Pain management: pain level controlled Vital Signs Assessment: post-procedure vital signs reviewed and stable Respiratory status: spontaneous breathing Cardiovascular status: stable Postop Assessment: no apparent nausea or vomiting Anesthetic complications: no   No notable events documented.  Last Vitals:  Vitals:   10/31/21 1618 10/31/21 1634  BP: (!) 139/96 140/90  Pulse: (!) 101 (!) 101  Resp: 15 16  Temp: 36.7 C   SpO2: 98% 95%    Last Pain:  Vitals:   10/31/21 1634  TempSrc:   PainSc: 0-No pain                 Ren Grasse

## 2021-11-01 DIAGNOSIS — M48061 Spinal stenosis, lumbar region without neurogenic claudication: Secondary | ICD-10-CM | POA: Diagnosis not present

## 2021-11-01 LAB — GLUCOSE, CAPILLARY: Glucose-Capillary: 157 mg/dL — ABNORMAL HIGH (ref 70–99)

## 2021-11-01 LAB — BASIC METABOLIC PANEL
Anion gap: 9 (ref 5–15)
BUN: 19 mg/dL (ref 6–20)
CO2: 24 mmol/L (ref 22–32)
Calcium: 8.5 mg/dL — ABNORMAL LOW (ref 8.9–10.3)
Chloride: 101 mmol/L (ref 98–111)
Creatinine, Ser: 1.82 mg/dL — ABNORMAL HIGH (ref 0.61–1.24)
GFR, Estimated: 43 mL/min — ABNORMAL LOW (ref >60)
Glucose, Bld: 160 mg/dL — ABNORMAL HIGH (ref 70–99)
Potassium: 5 mmol/L (ref 3.5–5.1)
Sodium: 134 mmol/L — ABNORMAL LOW (ref 135–145)

## 2021-11-01 LAB — CBC
HCT: 47.8 % (ref 39.0–52.0)
Hemoglobin: 16 g/dL (ref 13.0–17.0)
MCH: 29.4 pg (ref 26.0–34.0)
MCHC: 33.5 g/dL (ref 30.0–36.0)
MCV: 87.7 fL (ref 80.0–100.0)
Platelets: 177 10*3/uL (ref 150–400)
RBC: 5.45 MIL/uL (ref 4.22–5.81)
RDW: 14 % (ref 11.5–15.5)
WBC: 10.9 10*3/uL — ABNORMAL HIGH (ref 4.0–10.5)
nRBC: 0 % (ref 0.0–0.2)

## 2021-11-01 NOTE — Discharge Summary (Signed)
  Physician Discharge Summary  Patient ID: Brandon Byrd MRN: [id number] DOB/AGE: 08-13-1966 55 y.o.  Admit date: 10/31/2021 Discharge date: 11/01/2021  Admission Diagnoses:  Lumbar spondylolisthesis, L4-5  Discharge Diagnoses:  Same Principal Problem:   Spondylolisthesis of lumbar region   Discharged Condition: Stable  Hospital Course:  Brandon Byrd is a 55 y.o. male admitted after L4-5 fusion. He was ambulating well, tolerating diet, and voiding normally on POD#1.  Treatments: Surgery - L4-5 decomrpession/fusion  Discharge Exam: Blood pressure 114/77, pulse 88, temperature 98.6 F (37 C), temperature source Oral, resp. rate 18, height 5\' 8"  (1.727 m), weight 90.7 kg, SpO2 98 %. Awake, alert, oriented Speech fluent, appropriate CN grossly intact 5/5 BUE/BLE Wound c/d/i  Disposition: Discharge disposition: 01-Home or Self Care       Discharge Instructions     Call MD for:  redness, tenderness, or signs of infection (pain, swelling, redness, odor or green/yellow discharge around incision site)   Complete by: As directed    Call MD for:  temperature >100.4   Complete by: As directed    Diet - low sodium heart healthy   Complete by: As directed    Discharge instructions   Complete by: As directed    Walk at home as much as possible, at least 4 times / day   Increase activity slowly   Complete by: As directed    Lifting restrictions   Complete by: As directed    No lifting > 10 lbs   May shower / Bathe   Complete by: As directed    48 hours after surgery   May walk up steps   Complete by: As directed    Other Restrictions   Complete by: As directed    No bending/twisting at waist   Remove dressing in 24 hours   Complete by: As directed       Allergies as of 11/01/2021   No Known Allergies      Medication List     STOP taking these medications    aspirin EC 81 MG tablet       TAKE these medications    b complex vitamins capsule Take  0.5 capsules by mouth 3 (three) times a week.   empagliflozin 25 MG Tabs tablet Commonly known as: JARDIANCE Take 12.5 mg by mouth daily.   insulin glargine (1 Unit Dial) 300 UNIT/ML Solostar Pen Commonly known as: TOUJEO Inject 5 Units into the skin daily.   rosuvastatin 10 MG tablet Commonly known as: Crestor Take 1 tablet (10 mg total) by mouth daily.   valsartan 40 MG tablet Commonly known as: DIOVAN TAKE 1 TABLET BY MOUTH EVERY DAY   VITAMIN D (CHOLECALCIFEROL) PO Take 1 capsule by mouth 4 (four) times a week.         Signed14/07/2021 11/01/2021, 10:28 AM

## 2021-11-01 NOTE — Progress Notes (Signed)
Patient alert and oriented, mae's well, voiding adequate amount of urine, swallowing without difficulty, no c/o pain at time of discharge. Patient discharged home with family. Script and discharged instructions given to patient. Patient and family stated understanding of instructions given. Patient has an appointment with Dr. Nundkumar  

## 2021-11-01 NOTE — Evaluation (Signed)
Occupational Therapy Evaluation Patient Details Name: Brandon Byrd MRN: [id number] DOB: May 06, 1966 Today's Date: 11/01/2021   History of Present Illness 55 y.o. M s/p L4 lamectomy with facetectomy, interbody arthrodesis L4-5. PMH includes DM and CKD.   Clinical Impression   Pt independent with ADLs and functional mobility at baseline, lives with spouse/family who are able to provide assistance at d/c. Pt set up - min A for ADLs during session, requiring increased assistance for LB ADLs to adhere to precautions. Pt Mod I with bed mobility using log rolling technique, supervision -min guard for transfers. Pt reporting impaired sensation/numbness on front of RLE, RN aware. Educated pt on back precautions, compensatory strategies with AE for dressing, grooming, and tub transfer. Pt verbalized and demonstrated understanding. Pt limited by deficits listed below, however has no acute OT needs at this time, will s/o.       Recommendations for follow up therapy are one component of a multi-disciplinary discharge planning process, led by the attending physician.  Recommendations may be updated based on patient status, additional functional criteria and insurance authorization.   Follow Up Recommendations  No OT follow up    Assistance Recommended at Discharge Set up Supervision/Assistance  Functional Status Assessment  Patient has had a recent decline in their functional status and demonstrates the ability to make significant improvements in function in a reasonable and predictable amount of time.  Equipment Recommendations  BSC/3in1;Tub/shower seat    Recommendations for Other Services PT consult     Precautions / Restrictions Precautions Precautions: Back Precaution Booklet Issued: Yes (comment) Required Braces or Orthoses: Spinal Brace Spinal Brace: Lumbar corset Restrictions Weight Bearing Restrictions: No      Mobility Bed Mobility Overal bed mobility: Modified Independent              General bed mobility comments: uses log rolling technique appropriately    Transfers Overall transfer level: Needs assistance Equipment used: None Transfers: Sit to/from Stand Sit to Stand: Supervision;Min guard           General transfer comment: supervision/min guard due to RLE sensation impairments      Balance Overall balance assessment: Mild deficits observed, not formally tested                                         ADL either performed or assessed with clinical judgement   ADL Overall ADL's : Needs assistance/impaired Eating/Feeding: Set up;Sitting   Grooming: Set up;Sitting   Upper Body Bathing: Sitting;Minimal assistance   Lower Body Bathing: Minimal assistance;Sitting/lateral leans   Upper Body Dressing : Set up;Sitting   Lower Body Dressing: Supervision/safety;Adhering to back precautions;With adaptive equipment;Sit to/from stand Lower Body Dressing Details (indicate cue type and reason): able to don socks and pants using reacher and sock aid sitting EOB Toilet Transfer: Solicitor;Ambulation   Toileting- Clothing Manipulation and Hygiene: Supervision/safety;Sit to/from stand;Adhering to back precautions   Tub/ Shower Transfer: Tub transfer;Min guard;Ambulation Tub/Shower Transfer Details (indicate cue type and reason): simulated lateral stepping over tub wall into tub         Vision   Vision Assessment?: No apparent visual deficits     Perception     Praxis      Pertinent Vitals/Pain Pain Assessment: Faces Pain Score: 8  Faces Pain Scale: Hurts whole lot Pain Location: incision site Pain Descriptors / Indicators: Constant;Discomfort Pain Intervention(s): Limited activity within patient's  tolerance;Monitored during session;Repositioned     Hand Dominance     Extremity/Trunk Assessment Upper Extremity Assessment Upper Extremity Assessment: Overall WFL for tasks assessed   Lower Extremity  Assessment Lower Extremity Assessment: Defer to PT evaluation;RLE deficits/detail RLE Deficits / Details: pt reports numbness down front of RLE, difficulty with completing smooth knee flexion/extension movement RLE Sensation: decreased light touch;decreased proprioception   Cervical / Trunk Assessment Cervical / Trunk Assessment: Back Surgery   Communication Communication Communication: No difficulties   Cognition Arousal/Alertness: Awake/alert Behavior During Therapy: WFL for tasks assessed/performed Overall Cognitive Status: Within Functional Limits for tasks assessed                                       General Comments       Exercises     Shoulder Instructions      Home Living Family/patient expects to be discharged to:: Private residence Living Arrangements: Spouse/significant other;Children Available Help at Discharge: Family Type of Home: House Home Access: Stairs to enter Secretary/administrator of Steps: 1   Home Layout: Two level     Bathroom Shower/Tub: Chief Strategy Officer: Standard Bathroom Accessibility: No   Home Equipment: None          Prior Functioning/Environment Prior Level of Function : Independent/Modified Independent                        OT Problem List: Decreased strength;Decreased range of motion;Decreased activity tolerance;Impaired balance (sitting and/or standing);Decreased knowledge of use of DME or AE      OT Treatment/Interventions:      OT Goals(Current goals can be found in the care plan section) Acute Rehab OT Goals Patient Stated Goal: return PLOF OT Goal Formulation: With patient Time For Goal Achievement: 11/15/21 Potential to Achieve Goals: Good  OT Frequency:     Barriers to D/C:            Co-evaluation              AM-PAC OT "6 Clicks" Daily Activity     Outcome Measure Help from another person eating meals?: None Help from another person taking care of  personal grooming?: None Help from another person toileting, which includes using toliet, bedpan, or urinal?: A Little Help from another person bathing (including washing, rinsing, drying)?: A Little Help from another person to put on and taking off regular upper body clothing?: None Help from another person to put on and taking off regular lower body clothing?: A Little 6 Click Score: 21   End of Session Equipment Utilized During Treatment: Gait belt Nurse Communication: Mobility status  Activity Tolerance: Patient tolerated treatment well Patient left: in bed;with call bell/phone within reach  OT Visit Diagnosis: Unsteadiness on feet (R26.81);Other abnormalities of gait and mobility (R26.89);Muscle weakness (generalized) (M62.81);Pain                Time: 2536-6440 OT Time Calculation (min): 27 min Charges:  OT General Charges $OT Visit: 1 Visit OT Evaluation $OT Eval Low Complexity: 1 Low OT Treatments $Self Care/Home Management : 8-22 mins  Alfonzo Beers, OTD, OTR/L Acute Rehab [phone]) [phone]   Mayer Masker 11/01/2021, 9:15 AM

## 2021-11-01 NOTE — Evaluation (Signed)
Physical Therapy Evaluation and Discharge Patient Details Name: Brandon Byrd MRN: [id number] DOB: July 26, 1966 Today's Date: 11/01/2021  History of Present Illness  55 y/o male presenting w/ 3.5 year history of back and leg pain. CC is R-sided leg pain, numbness and tingling in the posterolateral R thigh and R leg w/ parasthesis of R and L foot. Symptom onset caused by walking for longer than a few minutes. Pt has tried conservative treatment consisting of anti-inflammatories, chiropractic manipulations,and epidural steroid injections w/ some relief. Elected to undergo L4 laminectomy w/ facetectomy for decompression of exiting nerve roots and interbody arthrodesis of L4-5 on 12/8.   PMH positive for DM and CKD.  Clinical Impression  Pt presents with above diagnosis. Mod I for bed mobility, transfers, and ambulation. Reports feeling numbness in anterior R thigh, which is different from baseline pre-surgery. Encouraged pt to discuss with MD. Ambulates with reduced dynamic ROM at both knees and lateral sway. Educated on spinal precautions, brace application and wearing schedule, activity progression, and car transfers. Pt has no further questions and anticipates to d/c today. Will s/o, if needs change, please reconsult.      Recommendations for follow up therapy are one component of a multi-disciplinary discharge planning process, led by the attending physician.  Recommendations may be updated based on patient status, additional functional criteria and insurance authorization.  Follow Up Recommendations No PT follow up    Assistance Recommended at Discharge None  Functional Status Assessment Patient has had a recent decline in their functional status and demonstrates the ability to make significant improvements in function in a reasonable and predictable amount of time.  Equipment Recommendations  None recommended by PT    Recommendations for Other Services       Precautions / Restrictions  Precautions Precautions: Back Precaution Booklet Issued: Yes (comment) Precaution Comments: Reviewed handout and pt was cued for precautions during functional mobility. Required Braces or Orthoses: Spinal Brace Spinal Brace: Lumbar corset Restrictions Weight Bearing Restrictions: No      Mobility  Bed Mobility Overal bed mobility: Modified Independent             General bed mobility comments: uses log rolling technique appropriately    Transfers Overall transfer level: Modified independent Equipment used: None Transfers: Sit to/from Stand Sit to Stand: Modified independent (Device/Increase time)           General transfer comment: requires more time to power up into standing    Ambulation/Gait Ambulation/Gait assistance: Modified independent (Device/Increase time) Gait Distance (Feet): 500 Feet Assistive device: None Gait Pattern/deviations: Step-through pattern;Decreased stride length;Wide base of support Gait velocity: decreased Gait velocity interpretation: 1.31 - 2.62 ft/sec, indicative of limited community ambulator   General Gait Details: Pt ambulates w/ reduced dynamic ROM in bilateral knees and lateral sway.  Stairs            Wheelchair Mobility    Modified Rankin (Stroke Patients Only)       Balance Overall balance assessment: Modified Independent                                           Pertinent Vitals/Pain Pain Assessment: Faces Faces Pain Scale: Hurts even more Pain Location: incision site; front of R thigh Pain Descriptors / Indicators: Constant;Discomfort;Numbness Pain Intervention(s): Limited activity within patient's tolerance;Monitored during session    Home Living Family/patient expects to be discharged to:: Private  residence Living Arrangements: Spouse/significant other;Children Available Help at Discharge: Family Type of Home: House Home Access: Stairs to enter   Secretary/administrator of Steps:  1   Home Layout: Two level Home Equipment: None      Prior Function Prior Level of Function : Independent/Modified Independent                     Hand Dominance        Extremity/Trunk Assessment   Upper Extremity Assessment Upper Extremity Assessment: Defer to OT evaluation    Lower Extremity Assessment Lower Extremity Assessment: RLE deficits/detail RLE Deficits / Details: pt reports numbness down front of RLE RLE Sensation: decreased light touch;decreased proprioception    Cervical / Trunk Assessment Cervical / Trunk Assessment: Back Surgery  Communication   Communication: No difficulties  Cognition Arousal/Alertness: Awake/alert Behavior During Therapy: WFL for tasks assessed/performed Overall Cognitive Status: Within Functional Limits for tasks assessed                                          General Comments      Exercises     Assessment/Plan    PT Assessment Patient does not need any further PT services  PT Problem List         PT Treatment Interventions      PT Goals (Current goals can be found in the Care Plan section)  Acute Rehab PT Goals Patient Stated Goal: Back to the gym PT Goal Formulation: All assessment and education complete, DC therapy    Frequency     Barriers to discharge        Co-evaluation               AM-PAC PT "6 Clicks" Mobility  Outcome Measure Help needed turning from your back to your side while in a flat bed without using bedrails?: None Help needed moving from lying on your back to sitting on the side of a flat bed without using bedrails?: None Help needed moving to and from a bed to a chair (including a wheelchair)?: None Help needed standing up from a chair using your arms (e.g., wheelchair or bedside chair)?: None Help needed to walk in hospital room?: None Help needed climbing 3-5 steps with a railing? : None 6 Click Score: 24    End of Session Equipment Utilized During  Treatment: Back brace Activity Tolerance: Patient tolerated treatment well Patient left: in bed;with call bell/phone within reach Nurse Communication: Mobility status PT Visit Diagnosis: Difficulty in walking, not elsewhere classified (R26.2)    Time: 5009-3818 PT Time Calculation (min) (ACUTE ONLY): 15 min   Charges:   PT Evaluation $PT Eval Low Complexity: 1 Low          Janyce Llanos, SPT Acute Rehabilitation Services Pager: [phone] Office: [phone]   Kiyaan Haq 11/01/2021, 12:17 PM

## 2021-11-19 ENCOUNTER — Other Ambulatory Visit: Payer: Self-pay | Admitting: Orthopedic Surgery

## 2021-11-19 ENCOUNTER — Other Ambulatory Visit (HOSPITAL_COMMUNITY): Payer: Self-pay | Admitting: Orthopedic Surgery

## 2021-11-19 DIAGNOSIS — M25512 Pain in left shoulder: Secondary | ICD-10-CM

## 2021-11-27 ENCOUNTER — Other Ambulatory Visit: Payer: Self-pay

## 2021-11-27 ENCOUNTER — Ambulatory Visit (HOSPITAL_COMMUNITY)
Admission: RE | Admit: 2021-11-27 | Discharge: 2021-11-27 | Disposition: A | Discharge status: Home / Self Care | Payer: No Typology Code available for payment source | Source: Ambulatory Visit | Attending: Orthopedic Surgery | Admitting: Orthopedic Surgery

## 2021-11-27 DIAGNOSIS — M25512 Pain in left shoulder: Secondary | ICD-10-CM | POA: Diagnosis present

## 2021-11-27 DIAGNOSIS — M25511 Pain in right shoulder: Secondary | ICD-10-CM | POA: Insufficient documentation

## 2021-11-27 MED ORDER — IOHEXOL 180 MG/ML  SOLN
20.0000 mL | Freq: Once | INTRAMUSCULAR | Status: AC | PRN
  Administered 2021-11-27: 20 mL via INTRA_ARTICULAR

## 2021-11-27 MED ORDER — LIDOCAINE HCL (PF) 1 % IJ SOLN
10.0000 mL | Freq: Once | INTRAMUSCULAR | Status: AC
  Administered 2021-11-27: 10 mL via INTRADERMAL

## 2021-11-27 MED ORDER — BUPIVACAINE HCL (PF) 0.25 % IJ SOLN: 10.0000 mL | Freq: Once | INTRAMUSCULAR | Status: DC

## 2021-11-27 MED ORDER — METHYLPREDNISOLONE ACETATE 40 MG/ML INJ SUSP (RADIOLOG
80.0000 mg | Freq: Once | INTRAMUSCULAR | Status: AC
  Administered 2021-11-27: 80 mg via INTRA_ARTICULAR

## 2021-11-27 MED ORDER — BUPIVACAINE HCL (PF) 0.25 % IJ SOLN
INTRAMUSCULAR | Status: AC
  Administered 2021-11-27: 30 mL via INTRA_ARTICULAR
  Filled 2021-11-27: qty 30

## 2021-11-27 MED ORDER — METHYLPREDNISOLONE ACETATE 40 MG/ML IJ SUSP
INTRAMUSCULAR | Status: AC
  Filled 2021-11-27: qty 2

## 2022-11-29 IMAGING — RF DG FLUORO GUIDE NDL PLC/BX
4 series · 4 of 4 positions shown · IV contrast (omnipaque)
Comparison: none

CLINICAL DATA: Bilateral shoulder pain

EXAM:
RIGHT AND LEFT SHOULDER INJECTION UNDER FLUOROSCOPY
TECHNIQUE: An appropriate skin entrance site was determined. The site was
marked, prepped with Betadine, draped in the usual sterile fashion,
and infiltrated locally with buffered Lidocaine. 22 gauge spinal
needle was advanced to the superomedial margin of the humeral head
under intermittent fluoroscopy. A mixture of 10 mL Sensorcaine, 40
mg methylprednisolone, and 10 ml of Omnipaque 180 was then used to
opacify the right and then left shoulder capsules. No immediate
complication.
FLUOROSCOPY TIME:  Fluoroscopy Time:  42 seconds
Radiation Exposure Index (if provided by the fluoroscopic device):
2.7 mGy

[Series 1: cp_standard · 0.19mm/px · 1 of 1 slices shown (1 of 4)]
[im 1/1]
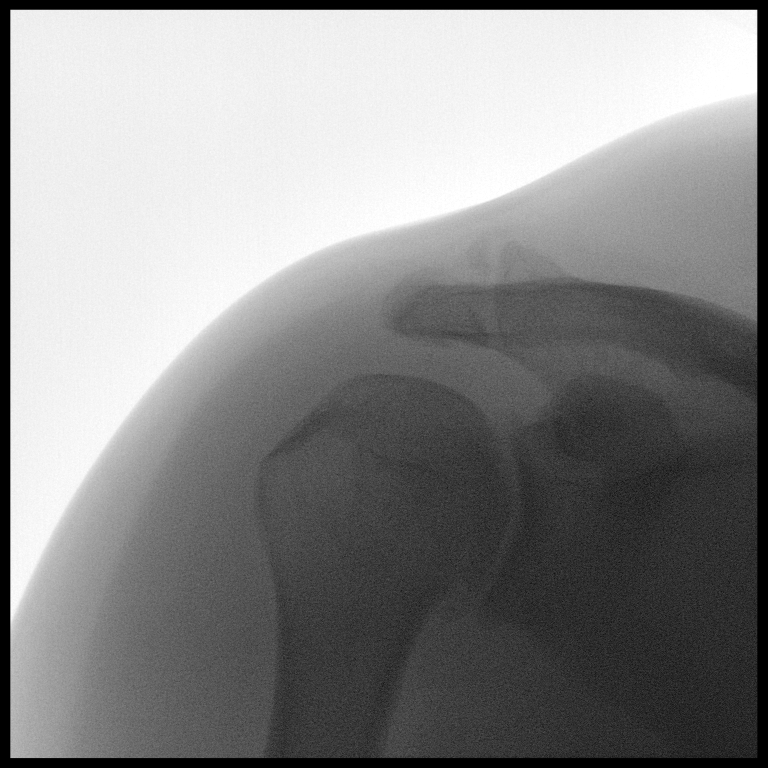

[Series 2: cp_standard · 0.18mm/px · 1 of 1 slices shown (2 of 4)]
[im 1/1]
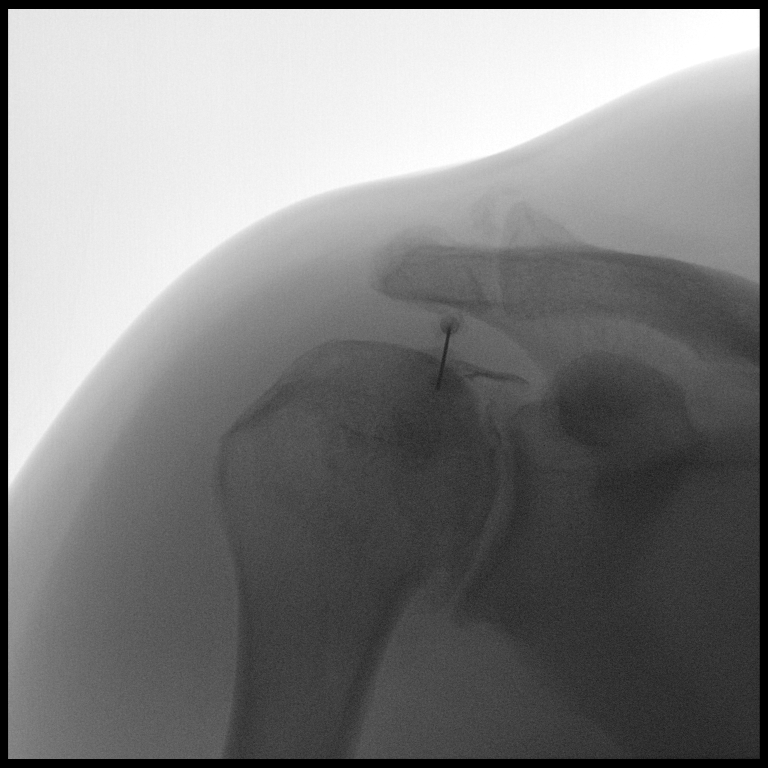

[Series 3: cp_standard · 0.18mm/px · 1 of 1 slices shown (3 of 4)]
[im 1/1]
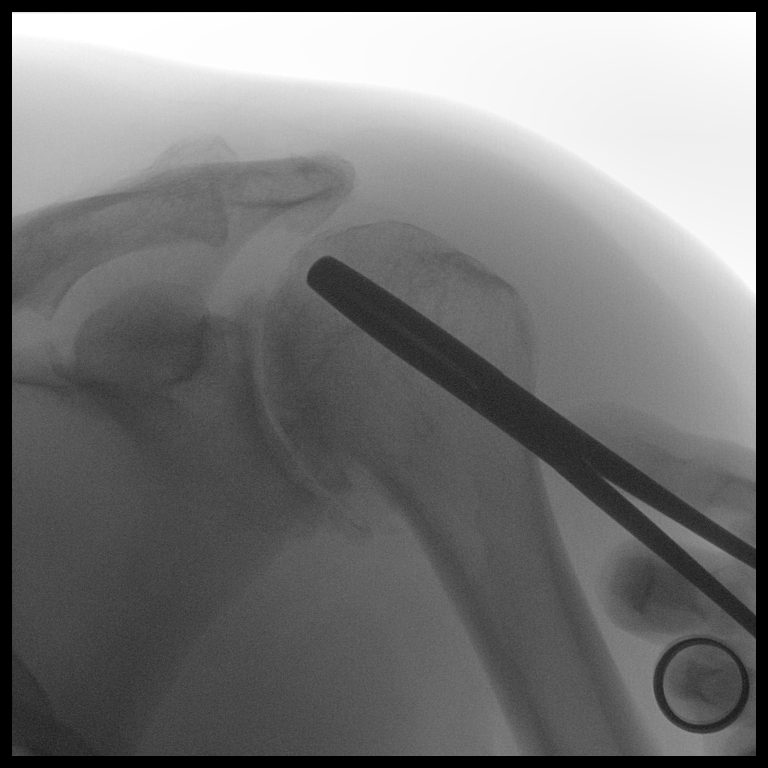

[Series 4: cp_standard · 0.18mm/px · 1 of 1 slices shown (4 of 4)]
[im 1/1]
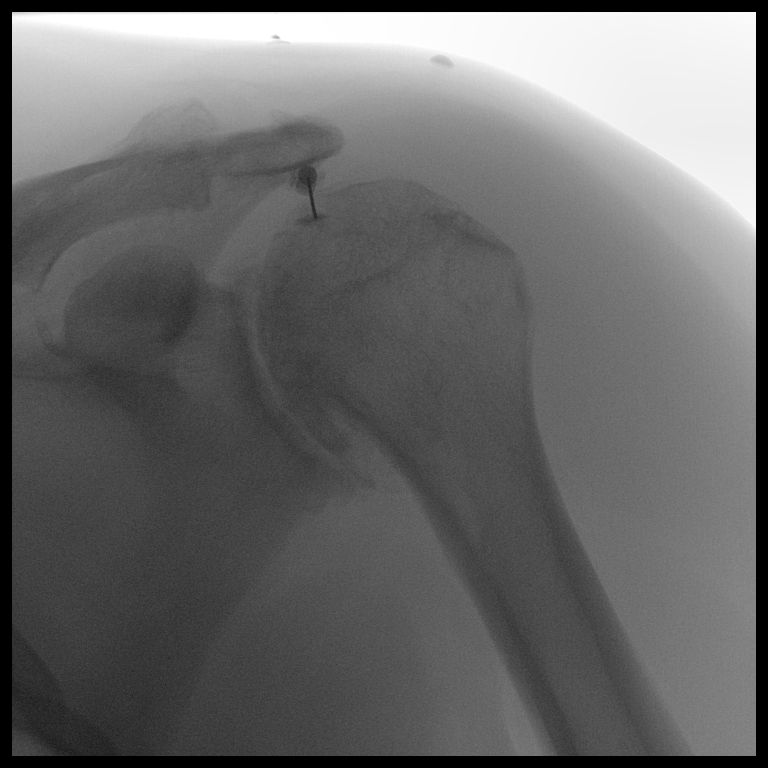

[4 of 4 positions shown; findings below may reference images not displayed]

FINDINGS: Contrast injection confirms intra-articular location bilaterally.
IMPRESSION: Technically successful right and left shoulder injections.

This exam was performed by Pool Jervis, NP, and was supervised
and interpreted by the dictating radiologist.
